# Patient Record
Sex: Female | Born: 1985 | Race: Black or African American | Hispanic: No | Marital: Single | Smoking: Never smoker
Health system: Southern US, Community
[De-identification: ages and names within clinical notes are randomized; demographics above are authoritative.]

## PROBLEM LIST (undated history)

## (undated) DIAGNOSIS — T8859XA Other complications of anesthesia, initial encounter: Secondary | ICD-10-CM

## (undated) DIAGNOSIS — IMO0002 Reserved for concepts with insufficient information to code with codable children: Secondary | ICD-10-CM

## (undated) DIAGNOSIS — T4145XA Adverse effect of unspecified anesthetic, initial encounter: Secondary | ICD-10-CM

## (undated) DIAGNOSIS — M329 Systemic lupus erythematosus, unspecified: Secondary | ICD-10-CM

## (undated) DIAGNOSIS — O009 Unspecified ectopic pregnancy without intrauterine pregnancy: Secondary | ICD-10-CM

## (undated) DIAGNOSIS — H409 Unspecified glaucoma: Secondary | ICD-10-CM

## (undated) DIAGNOSIS — D649 Anemia, unspecified: Secondary | ICD-10-CM

## (undated) HISTORY — PX: REFRACTIVE SURGERY: SHX103

## (undated) MED FILL — Ferric Derisomaltose (One Dose) IV Sol 1000 MG/10ML (Fe Eq): INTRAVENOUS | Qty: 10 | Status: AC

---
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
## 2006-05-22 ENCOUNTER — Emergency Department (HOSPITAL_COMMUNITY): Admission: EM | Admit: 2006-05-22 | Discharge: 2006-05-22 | Payer: Self-pay | Admitting: Emergency Medicine

## 2006-11-06 ENCOUNTER — Emergency Department (HOSPITAL_COMMUNITY): Admission: EM | Admit: 2006-11-06 | Discharge: 2006-11-06 | Payer: Self-pay | Admitting: Emergency Medicine

## 2007-02-05 ENCOUNTER — Emergency Department (HOSPITAL_COMMUNITY): Admission: EM | Admit: 2007-02-05 | Discharge: 2007-02-05 | Payer: Self-pay | Admitting: Family Medicine

## 2007-06-22 ENCOUNTER — Emergency Department (HOSPITAL_COMMUNITY): Admission: EM | Admit: 2007-06-22 | Discharge: 2007-06-22 | Payer: Self-pay | Admitting: Emergency Medicine

## 2009-11-03 ENCOUNTER — Emergency Department (HOSPITAL_COMMUNITY): Admission: EM | Admit: 2009-11-03 | Discharge: 2009-11-03 | Payer: Self-pay | Admitting: Emergency Medicine

## 2009-12-18 ENCOUNTER — Emergency Department (HOSPITAL_COMMUNITY): Admission: EM | Admit: 2009-12-18 | Discharge: 2009-12-18 | Payer: Self-pay | Admitting: Family Medicine

## 2010-01-29 ENCOUNTER — Emergency Department (HOSPITAL_COMMUNITY)
Admission: EM | Admit: 2010-01-29 | Discharge: 2010-01-29 | Payer: Self-pay | Source: Home / Self Care | Admitting: Emergency Medicine

## 2010-05-04 LAB — POCT URINALYSIS DIPSTICK
Glucose, UA: NEGATIVE mg/dL
Nitrite: NEGATIVE
Protein, ur: 30 mg/dL — AB
Specific Gravity, Urine: >=1.03 (ref 1.005–1.030)
Urobilinogen, UA: 1 mg/dL (ref 0.0–1.0)

## 2010-05-04 LAB — POCT PREGNANCY, URINE: Preg Test, Ur: NEGATIVE

## 2010-05-05 LAB — POCT URINALYSIS DIPSTICK
Glucose, UA: NEGATIVE mg/dL
Nitrite: POSITIVE — AB
Protein, ur: 30 mg/dL — AB
Specific Gravity, Urine: 1.02 (ref 1.005–1.030)
Urobilinogen, UA: 2 mg/dL — ABNORMAL HIGH (ref 0.0–1.0)

## 2010-05-05 LAB — POCT PREGNANCY, URINE: Preg Test, Ur: NEGATIVE

## 2010-05-05 LAB — WET PREP, GENITAL: Yeast Wet Prep HPF POC: NONE SEEN

## 2010-12-02 LAB — CBC
HCT: 29 — ABNORMAL LOW
Hemoglobin: 9.4 — ABNORMAL LOW
MCV: 74.8 — ABNORMAL LOW
WBC: 5.8

## 2010-12-02 LAB — DIFFERENTIAL
Eosinophils Absolute: 0.1
Eosinophils Relative: 3
Lymphocytes Relative: 44
Lymphs Abs: 2.6
Monocytes Absolute: 0.6

## 2010-12-02 LAB — URINALYSIS, ROUTINE W REFLEX MICROSCOPIC
Glucose, UA: NEGATIVE
Ketones, ur: NEGATIVE
Nitrite: NEGATIVE
Protein, ur: NEGATIVE
pH: 7

## 2010-12-02 LAB — URINE MICROSCOPIC-ADD ON

## 2010-12-02 LAB — PREGNANCY, URINE: Preg Test, Ur: NEGATIVE

## 2010-12-02 LAB — WET PREP, GENITAL

## 2011-09-20 ENCOUNTER — Encounter (HOSPITAL_COMMUNITY): Payer: Self-pay | Admitting: Emergency Medicine

## 2011-09-20 ENCOUNTER — Emergency Department (HOSPITAL_COMMUNITY)
Admission: EM | Admit: 2011-09-20 | Discharge: 2011-09-20 | Disposition: A | Discharge status: Home / Self Care | Payer: Self-pay | Attending: Emergency Medicine | Admitting: Emergency Medicine

## 2011-09-20 DIAGNOSIS — J020 Streptococcal pharyngitis: Secondary | ICD-10-CM

## 2011-09-20 DIAGNOSIS — H66019 Acute suppurative otitis media with spontaneous rupture of ear drum, unspecified ear: Secondary | ICD-10-CM | POA: Insufficient documentation

## 2011-09-20 HISTORY — DX: Unspecified glaucoma: H40.9

## 2011-09-20 LAB — RAPID STREP SCREEN (MED CTR MEBANE ONLY): Streptococcus, Group A Screen (Direct): POSITIVE — AB

## 2011-09-20 MED ORDER — AMOXICILLIN 500 MG PO CAPS: 500.0000 mg | ORAL_CAPSULE | Freq: Three times a day (TID) | ORAL | Status: AC

## 2011-09-20 NOTE — ED Notes (Signed)
Started with a sore throat last Wednesday, right ear ache- woke up this am with right ear draining, greenish drainage. Throat slightly reddened, swollen tonsil right side

## 2011-09-20 NOTE — ED Provider Notes (Signed)
Medical screening examination/treatment/procedure(s) were performed by non-physician practitioner and as supervising physician I was immediately available for consultation/collaboration.  Doug Sou, MD 09/20/11 1704

## 2011-09-20 NOTE — ED Provider Notes (Signed)
History     CSN: [id number]  Arrival date & time 09/20/11  1159   First MD Initiated Contact with Patient 09/20/11 1339      Chief Complaint  Patient presents with  . Otalgia  . Sore Throat    (Consider location/radiation/quality/duration/timing/severity/associated sxs/prior treatment) HPI  26 year old female in no acute distress complaining of sore throat that began 6 days ago patient had a pain in her right ear and woke up this morning with greenish drainage from the ear. Patient denies fever, cough, tinnitus, rhinorrhea, nausea, vomiting,abdominal pain  Past Medical History  Diagnosis Date  . Glaucoma     follows up with opthalmologist once a year for pressure check    Past Surgical History  Procedure Date  . Refractive surgery     History reviewed. No pertinent family history.  History  Substance Use Topics  . Smoking status: Never Smoker   . Smokeless tobacco: Not on file  . Alcohol Use: Yes     socially    OB History    Grav Para Term Preterm Abortions TAB SAB Ect Mult Living                  Review of Systems  Constitutional: Negative for fatigue.  HENT: Positive for ear pain. Negative for tinnitus.   Respiratory: Negative for cough.   All other systems reviewed and are negative.    Allergies  Review of patient's allergies indicates no known allergies.  Home Medications   Current Outpatient Rx  Name Route Sig Dispense Refill  . ACETAMINOPHEN 325 MG PO TABS Oral Take 650 mg by mouth every 6 (six) hours as needed. For sore throat, ear pain      BP 121/89  Pulse 72  Temp 98.8 F (37.1 C) (Oral)  Resp 14  SpO2 100%  LMP 09/15/2011  Physical Exam  Vitals reviewed. Constitutional: She is oriented to person, place, and time. She appears well-developed and well-nourished. No distress.  HENT:  Head: Normocephalic and atraumatic.  Right Ear: External ear normal.  Nose: Nose normal.  Mouth/Throat: Oropharynx is clear and moist.       Mild  tonsillar hypertrophy with no exudate. Patient has a ruptured right tympanic membrane with purulent drainage. Left TM normal  Eyes: Conjunctivae and EOM are normal. Pupils are equal, round, and reactive to light.  Neck: Normal range of motion.  Cardiovascular: Normal rate.   Pulmonary/Chest: Effort normal.  Musculoskeletal: Normal range of motion.  Lymphadenopathy:    She has cervical adenopathy.  Neurological: She is alert and oriented to person, place, and time.  Psychiatric: She has a normal mood and affect.    ED Course  Procedures (including critical care time)  Labs Reviewed  RAPID STREP SCREEN - Abnormal; Notable for the following:    Streptococcus, Group A Screen (Direct) POSITIVE (*)     All other components within normal limits   No results found.   1. Acute suppurative otitis media with spontaneous rupture of eardrum       MDM  Patient presenting with sore throat positive and her rapid strep screen patient also has otitis media with acute rupture of the right tympanic membrane. I'll  treat with amoxicillin for 10 days.         Wynetta Emery, PA-C 09/20/11 1433

## 2012-12-14 ENCOUNTER — Encounter (HOSPITAL_COMMUNITY)
Admission: AD | Disposition: A | Discharge status: Home / Self Care | Payer: Self-pay | Source: Ambulatory Visit | Attending: Obstetrics & Gynecology

## 2012-12-14 ENCOUNTER — Observation Stay (HOSPITAL_COMMUNITY)
Admission: AD | Admit: 2012-12-14 | Discharge: 2012-12-15 | Disposition: A | Discharge status: Home / Self Care | Payer: Medicaid Other | Source: Ambulatory Visit | Attending: Obstetrics & Gynecology | Admitting: Obstetrics & Gynecology

## 2012-12-14 ENCOUNTER — Ambulatory Visit (HOSPITAL_BASED_OUTPATIENT_CLINIC_OR_DEPARTMENT_OTHER): Admit: 2012-12-14 | Payer: Self-pay | Admitting: Obstetrics and Gynecology

## 2012-12-14 ENCOUNTER — Inpatient Hospital Stay (HOSPITAL_BASED_OUTPATIENT_CLINIC_OR_DEPARTMENT_OTHER): Payer: Medicaid Other | Admitting: Anesthesiology

## 2012-12-14 ENCOUNTER — Encounter (HOSPITAL_BASED_OUTPATIENT_CLINIC_OR_DEPARTMENT_OTHER): Payer: Medicaid Other | Admitting: Anesthesiology

## 2012-12-14 ENCOUNTER — Inpatient Hospital Stay (HOSPITAL_COMMUNITY): Payer: Medicaid Other

## 2012-12-14 ENCOUNTER — Encounter (HOSPITAL_COMMUNITY): Payer: Self-pay

## 2012-12-14 ENCOUNTER — Ambulatory Visit (HOSPITAL_BASED_OUTPATIENT_CLINIC_OR_DEPARTMENT_OTHER): Admission: RE | Admit: 2012-12-14 | Payer: Self-pay | Source: Ambulatory Visit | Admitting: Obstetrics and Gynecology

## 2012-12-14 DIAGNOSIS — D251 Intramural leiomyoma of uterus: Secondary | ICD-10-CM | POA: Insufficient documentation

## 2012-12-14 DIAGNOSIS — O009 Unspecified ectopic pregnancy without intrauterine pregnancy: Secondary | ICD-10-CM | POA: Diagnosis present

## 2012-12-14 DIAGNOSIS — R109 Unspecified abdominal pain: Secondary | ICD-10-CM

## 2012-12-14 DIAGNOSIS — O00109 Unspecified tubal pregnancy without intrauterine pregnancy: Secondary | ICD-10-CM

## 2012-12-14 HISTORY — PX: OPERATIVE ULTRASOUND: SHX5996

## 2012-12-14 LAB — CBC
HCT: 28.8 % — ABNORMAL LOW (ref 36.0–46.0)
HCT: 27.2 % — ABNORMAL LOW (ref 36.0–46.0)
Hemoglobin: 9.5 g/dL — ABNORMAL LOW (ref 12.0–15.0)
MCH: 27.7 pg (ref 26.0–34.0)
MCH: 28.4 pg (ref 26.0–34.0)
MCHC: 34 g/dL (ref 30.0–36.0)
MCHC: 34.9 g/dL (ref 30.0–36.0)
MCV: 81.4 fL (ref 78.0–100.0)
MCV: 81.2 fL (ref 78.0–100.0)
Platelets: 295 10*3/uL (ref 150–400)
RBC: 3.35 MIL/uL — ABNORMAL LOW (ref 3.87–5.11)
RDW: 13.5 % (ref 11.5–15.5)
WBC: 10.1 10*3/uL (ref 4.0–10.5)
WBC: 7.8 10*3/uL (ref 4.0–10.5)

## 2012-12-14 LAB — COMPREHENSIVE METABOLIC PANEL
ALT: 10 U/L (ref 0–35)
AST: 14 U/L (ref 0–37)
Albumin: 3.1 g/dL — ABNORMAL LOW (ref 3.5–5.2)
Alkaline Phosphatase: 51 U/L (ref 39–117)
Potassium: 3.3 mEq/L — ABNORMAL LOW (ref 3.5–5.1)
Sodium: 136 mEq/L (ref 135–145)
Total Protein: 6.7 g/dL (ref 6.0–8.3)

## 2012-12-14 LAB — TYPE AND SCREEN: Unit division: 0

## 2012-12-14 LAB — HCG, QUANTITATIVE, PREGNANCY: hCG, Beta Chain, Quant, S: 53276 m[IU]/mL — ABNORMAL HIGH (ref <5)

## 2012-12-14 LAB — POCT HEMOGLOBIN-HEMACUE: Hemoglobin: 9.6 g/dL — ABNORMAL LOW (ref 12.0–15.0)

## 2012-12-14 LAB — ABO/RH: ABO/RH(D): A POS

## 2012-12-14 LAB — PREPARE RBC (CROSSMATCH)

## 2012-12-14 SURGERY — US INTRAOPERATIVE
Anesthesia: Monitor Anesthesia Care | Site: Uterus | Laterality: Right | Wound class: Clean Contaminated

## 2012-12-14 SURGERY — US INTRAOPERATIVE
Anesthesia: Monitor Anesthesia Care | Laterality: Right

## 2012-12-14 MED ORDER — ACETAMINOPHEN 325 MG PO TABS: 650.0000 mg | ORAL_TABLET | ORAL | Status: DC | PRN

## 2012-12-14 MED ORDER — LACTATED RINGERS IV SOLN: INTRAVENOUS | Status: DC

## 2012-12-14 MED ORDER — PROMETHAZINE HCL 25 MG/ML IJ SOLN
6.2500 mg | INTRAMUSCULAR | Status: DC | PRN
  Filled 2012-12-14: qty 1

## 2012-12-14 MED ORDER — HYDROCODONE-ACETAMINOPHEN 5-325 MG PO TABS
1.0000 | ORAL_TABLET | ORAL | Status: DC | PRN
  Administered 2012-12-14: 2 via ORAL
  Filled 2012-12-14: qty 2

## 2012-12-14 MED ORDER — POTASSIUM CHLORIDE 2 MEQ/ML IV SOLN
5.0000 meq | Freq: Once | INTRAVENOUS | Status: AC
  Administered 2012-12-14: 5 meq
  Filled 2012-12-14 (×2): qty 2.5

## 2012-12-14 MED ORDER — LIDOCAINE HCL 1 % IJ SOLN
INTRAMUSCULAR | Status: DC | PRN
  Administered 2012-12-14: 10 mL

## 2012-12-14 MED ORDER — MIDAZOLAM HCL 2 MG/2ML IJ SOLN: 0.5000 mg | Freq: Once | INTRAMUSCULAR | Status: DC | PRN

## 2012-12-14 MED ORDER — MEPERIDINE HCL 25 MG/ML IJ SOLN: 6.2500 mg | INTRAMUSCULAR | Status: DC | PRN

## 2012-12-14 MED ORDER — ONDANSETRON HCL 4 MG/2ML IJ SOLN
4.0000 mg | Freq: Four times a day (QID) | INTRAMUSCULAR | Status: DC | PRN
  Administered 2012-12-14: 4 mg via INTRAVENOUS
  Filled 2012-12-14: qty 2

## 2012-12-14 MED ORDER — OXYCODONE-ACETAMINOPHEN 5-325 MG PO TABS
1.0000 | ORAL_TABLET | ORAL | Status: DC | PRN
  Administered 2012-12-15: 1 via ORAL
  Filled 2012-12-14: qty 1

## 2012-12-14 MED ORDER — HYDROMORPHONE HCL PF 1 MG/ML IJ SOLN: 0.2500 mg | INTRAMUSCULAR | Status: DC | PRN

## 2012-12-14 MED ORDER — METHOTREXATE INJECTION FOR WOMEN'S HOSPITAL
50.0000 mg/m2 | Freq: Once | INTRAMUSCULAR | Status: AC
  Administered 2012-12-14: 90 mg via INTRAMUSCULAR
  Filled 2012-12-14: qty 1.8

## 2012-12-14 MED ORDER — PROMETHAZINE HCL 25 MG/ML IJ SOLN: 6.2500 mg | INTRAMUSCULAR | Status: DC | PRN

## 2012-12-14 MED ORDER — DEXTROSE 5 % IV SOLN
100.0000 mg | Freq: Two times a day (BID) | INTRAVENOUS | Status: DC
  Administered 2012-12-14: 100 mg via INTRAVENOUS
  Filled 2012-12-14 (×2): qty 100

## 2012-12-14 MED ORDER — FENTANYL CITRATE 0.05 MG/ML IJ SOLN
INTRAMUSCULAR | Status: DC | PRN
  Administered 2012-12-14 (×2): 25 ug via INTRAVENOUS
  Administered 2012-12-14: 50 ug via INTRAVENOUS

## 2012-12-14 MED ORDER — ONDANSETRON HCL 4 MG PO TABS
4.0000 mg | ORAL_TABLET | Freq: Four times a day (QID) | ORAL | Status: DC | PRN
  Administered 2012-12-15: 4 mg via ORAL
  Filled 2012-12-14: qty 1

## 2012-12-14 MED ORDER — PROPOFOL 10 MG/ML IV EMUL
INTRAVENOUS | Status: DC | PRN
  Administered 2012-12-14: 140 ug/kg/min via INTRAVENOUS

## 2012-12-14 MED ORDER — KETOROLAC TROMETHAMINE 30 MG/ML IJ SOLN: 15.0000 mg | Freq: Once | INTRAMUSCULAR | Status: AC | PRN

## 2012-12-14 MED ORDER — LACTATED RINGERS IV SOLN
INTRAVENOUS | Status: DC
  Administered 2012-12-14 (×3): via INTRAVENOUS

## 2012-12-14 MED ORDER — LACTATED RINGERS IV BOLUS (SEPSIS)
1000.0000 mL | Freq: Once | INTRAVENOUS | Status: AC
  Administered 2012-12-14: 1000 mL via INTRAVENOUS

## 2012-12-14 MED ORDER — SIMETHICONE 80 MG PO CHEW
80.0000 mg | CHEWABLE_TABLET | Freq: Four times a day (QID) | ORAL | Status: DC | PRN
  Filled 2012-12-14: qty 1

## 2012-12-14 MED ORDER — OXYCODONE-ACETAMINOPHEN 5-325 MG PO TABS
2.0000 | ORAL_TABLET | Freq: Once | ORAL | Status: AC
  Administered 2012-12-14: 2 via ORAL
  Filled 2012-12-14: qty 2

## 2012-12-14 MED ORDER — TEMAZEPAM 15 MG PO CAPS: 15.0000 mg | ORAL_CAPSULE | Freq: Every evening | ORAL | Status: DC | PRN

## 2012-12-14 MED ORDER — FENTANYL CITRATE 0.05 MG/ML IJ SOLN: 25.0000 ug | INTRAMUSCULAR | Status: DC | PRN

## 2012-12-14 MED ORDER — OXYCODONE-ACETAMINOPHEN 5-325 MG PO TABS: 1.0000 | ORAL_TABLET | ORAL | Status: DC | PRN

## 2012-12-14 MED ORDER — MIDAZOLAM HCL 5 MG/5ML IJ SOLN
INTRAMUSCULAR | Status: DC | PRN
  Administered 2012-12-14: 2 mg via INTRAVENOUS

## 2012-12-14 SURGICAL SUPPLY — 21 items
CATH ROBINSON RED A/P 16FR (CATHETERS) ×2 IMPLANT
CLOTH BEACON ORANGE TIMEOUT ST (SAFETY) ×2 IMPLANT
COVER TABLE BACK 60X90 (DRAPES) ×2 IMPLANT
DRAPE LG THREE QUARTER DISP (DRAPES) ×2 IMPLANT
DRAPE UNDERBUTTOCKS STRL (DRAPE) ×2 IMPLANT
DRESSING TELFA 8X3 (GAUZE/BANDAGES/DRESSINGS) ×2 IMPLANT
GLOVE BIO SURGEON STRL SZ7 (GLOVE) ×2 IMPLANT
GLOVE BIOGEL M 8.0 STRL (GLOVE) ×4 IMPLANT
GLOVE INDICATOR 7.0 STRL GRN (GLOVE) ×4 IMPLANT
GOWN PREVENTION PLUS LG XLONG (DISPOSABLE) ×2 IMPLANT
LEGGING LITHOTOMY PAIR STRL (DRAPES) ×2 IMPLANT
NEEDLE SPNL 22GX3.5 QUINCKE BK (NEEDLE) ×2 IMPLANT
NS IRRIG 500ML POUR BTL (IV SOLUTION) IMPLANT
PACK BASIN DAY SURGERY FS (CUSTOM PROCEDURE TRAY) ×2 IMPLANT
PAD OB MATERNITY 4.3X12.25 (PERSONAL CARE ITEMS) ×2 IMPLANT
PAD PREP 24X48 CUFFED NSTRL (MISCELLANEOUS) ×2 IMPLANT
SURGILUBE 2OZ TUBE FLIPTOP (MISCELLANEOUS) ×2 IMPLANT
SYR CONTROL 10ML LL (SYRINGE) ×2 IMPLANT
TOWEL OR 17X24 6PK STRL BLUE (TOWEL DISPOSABLE) ×2 IMPLANT
TRAY DSU PREP LF (CUSTOM PROCEDURE TRAY) ×2 IMPLANT
WATER STERILE IRR 500ML POUR (IV SOLUTION) ×2 IMPLANT

## 2012-12-14 NOTE — Anesthesia Preprocedure Evaluation (Addendum)
Anesthesia Evaluation  Patient identified by MRN, date of birth, ID band Patient awake    Reviewed: Allergy & Precautions, H&P , NPO status , Patient's Chart, lab work & pertinent test results  Airway Mallampati: II TM Distance: >3 FB Neck ROM: Full    Dental no notable dental hx. (+) Teeth Intact and Dental Advisory Given   Pulmonary neg pulmonary ROS,  breath sounds clear to auscultation  Pulmonary exam normal       Cardiovascular negative cardio ROS  Rhythm:Regular Rate:Normal     Neuro/Psych negative neurological ROS  negative psych ROS   GI/Hepatic negative GI ROS, Neg liver ROS,   Endo/Other  negative endocrine ROS  Renal/GU negative Renal ROS  negative genitourinary   Musculoskeletal negative musculoskeletal ROS (+)   Abdominal   Peds  Hematology negative hematology ROS (+)   Anesthesia Other Findings Ectopic pregnancy at [redacted] wks gestation.  Reproductive/Obstetrics Right Ectopic pregnancy                        Anesthesia Physical Anesthesia Plan  ASA: II and emergent  Anesthesia Plan: MAC   Post-op Pain Management:    Induction: Intravenous, Rapid sequence and Cricoid pressure planned  Airway Management Planned:   Additional Equipment:   Intra-op Plan:   Post-operative Plan:   Informed Consent: I have reviewed the patients History and Physical, chart, labs and discussed the procedure including the risks, benefits and alternatives for the proposed anesthesia with the patient or authorized representative who has indicated his/her understanding and acceptance.   Dental advisory given  Plan Discussed with: CRNA, Anesthesiologist and Surgeon  Anesthesia Plan Comments:        Anesthesia Quick Evaluation

## 2012-12-14 NOTE — Progress Notes (Signed)
Transferred to [facility] via stretcher by [facility]. Report given to Cira Rue RN. Room assignment 318.

## 2012-12-14 NOTE — Progress Notes (Signed)
Dr. Penne Lash states patient will go to [facility] surgical center around 1030. Requests that MTX be given prior to transport.

## 2012-12-14 NOTE — Progress Notes (Signed)
12/14/12 1600  Clinical Encounter Type  Visited With Patient not available (Spiritual Care following from a distance; page for visit.)   Spiritual Care notified of pt's situation just as she was preparing for transport from [facility] to [facility] for procedure.  Reported situation at shift change (4pm) for night/weekend chaplain's information.  Please page for spiritual support tonight or this weekend as would be helpful for pt:  [phone].  Thank you!  [address] [place], [place] [phone]

## 2012-12-14 NOTE — Progress Notes (Signed)
Patient returned to U/S with Dr. Penne Lash and Dr. April Manson.

## 2012-12-14 NOTE — OR Nursing (Signed)
2.21ml= mEq of 2 mEq/ml injected by Dr. April Manson. PT transferred from [facility] to [facility]

## 2012-12-14 NOTE — MAU Provider Note (Signed)
Chief Complaint: Abdominal Pain   First Provider Initiated Contact with Patient 12/14/12 0301      SUBJECTIVE HPI: Dominique Jones is a 27 y.o. G1P0 at [redacted]w[redacted]d by LMP who presents with severe low abd cramping radiating to right buttock since 0145. Pos UPT at HD last week. No other testing at this pregnancy. Denies vaginal, vaginal discharge, fever, chills, urinary complaints, GI complaints except for mild constipation. Last bowel movement yesterday. Last solids 2200.  Past Medical History  Diagnosis Date  . Glaucoma     follows up with opthalmologist once a year for pressure check   OB History  Gravida Para Term Preterm AB SAB TAB Ectopic Multiple Living  1             # Outcome Date GA Lbr Len/2nd Weight Sex Delivery Anes PTL Lv  1 CUR              Past Surgical History  Procedure Laterality Date  . Refractive surgery     History   Social History  . Marital Status: Single    Spouse Name: N/A    Number of Children: N/A  . Years of Education: N/A   Occupational History  . Not on file.   Social History Main Topics  . Smoking status: Never Smoker   . Smokeless tobacco: Not on file  . Alcohol Use: Yes     Comment: socially  . Drug Use: No  . Sexual Activity: Yes    Birth Control/ Protection: None   Other Topics Concern  . Not on file   Social History Narrative  . No narrative on file   No current facility-administered medications on file prior to encounter.   Current Outpatient Prescriptions on File Prior to Encounter  Medication Sig Dispense Refill  . acetaminophen (TYLENOL) 325 MG tablet Take 650 mg by mouth every 6 (six) hours as needed. For sore throat, ear pain       No Known Allergies  ROS: Pertinent items in HPI  OBJECTIVE Blood pressure 150/82, pulse 80, temperature 97.8 F (36.6 C), temperature source Oral, resp. rate 20, height 5\' 6"  (1.676 m), weight 69.945 kg (154 lb 3.2 oz), last menstrual period 10/05/2012, SpO2 100.00%. GENERAL: Well-developed,  well-nourished female in moderate distress, rocking back and forth.  HEENT: Normocephalic HEART: normal rate RESP: normal effort ABDOMEN: Distended. Mild, bilateral, diffuse low abdominal tenderness. Positive bowel sounds. Positive guarding. UTA mass. Negative rebound. EXTREMITIES: Nontender, no edema NEURO: Alert and oriented SPECULUM EXAM: Deferred  LAB RESULTS Results for orders placed during the hospital encounter of 12/14/12 (from the past 24 hour(s))  HCG, QUANTITATIVE, PREGNANCY     Status: Abnormal   Collection Time    12/14/12  3:15 AM      Result Value Range   hCG, Beta Chain, Quant, [place] [address] (*) <5 mIU/mL  ABO/RH     Status: None   Collection Time    12/14/12  3:15 AM      Result Value Range   ABO/RH(D) A POS    COMPREHENSIVE METABOLIC PANEL     Status: Abnormal   Collection Time    12/14/12  3:15 AM      Result Value Range   Sodium 136  135 - 145 mEq/L   Potassium 3.3 (*) 3.5 - 5.1 mEq/L   Chloride 99  96 - 112 mEq/L   CO2 25  19 - 32 mEq/L   Glucose, Bld 112 (*) 70 - 99 mg/dL  BUN 12  6 - 23 mg/dL   Creatinine, Ser 9.14  0.50 - 1.10 mg/dL   Calcium 8.9  8.4 - 78.2 mg/dL   Total Protein 6.7  6.0 - 8.3 g/dL   Albumin 3.1 (*) 3.5 - 5.2 g/dL   AST 14  0 - 37 U/L   ALT 10  0 - 35 U/L   Alkaline Phosphatase 51  39 - 117 U/L   Total Bilirubin 0.1 (*) 0.3 - 1.2 mg/dL   GFR calc non Af Amer >90  >90 mL/min   GFR calc Af Amer >90  >90 mL/min  CBC     Status: Abnormal   Collection Time    12/14/12  3:15 AM      Result Value Range   WBC 10.1  4.0 - 10.5 K/uL   RBC 3.54 (*) 3.87 - 5.11 MIL/uL   Hemoglobin 9.8 (*) 12.0 - 15.0 g/dL   HCT 95.6 (*) 21.3 - 08.6 %   MCV 81.4  78.0 - 100.0 fL   MCH 27.7  26.0 - 34.0 pg   MCHC 34.0  30.0 - 36.0 g/dL   RDW 57.8  46.9 - 62.9 %   Platelets 316  150 - 400 K/uL  TYPE AND SCREEN     Status: None   Collection Time    12/14/12  5:15 AM      Result Value Range   ABO/RH(D) A POS     Antibody Screen NEG     Sample Expiration  12/17/2012     Unit Number [id number]     Blood Component Type RED CELLS,LR     Unit division 00     Status of Unit ALLOCATED     Transfusion Status OK TO TRANSFUSE     Crossmatch Result Compatible     Unit Number [id number]     Blood Component Type RED CELLS,LR     Unit division 00     Status of Unit ALLOCATED     Transfusion Status OK TO TRANSFUSE     Crossmatch Result Compatible    PREPARE RBC (CROSSMATCH)     Status: None   Collection Time    12/14/12  5:30 AM      Result Value Range   Order Confirmation ORDER PROCESSED BY BLOOD BANK      IMAGING US Ob Comp Less 14 Wks  12/14/2012   CLINICAL DATA:  Lower abdominal cramping. Estimated gestational age by LMP is 10 weeks 0 days.  EXAM: OBSTETRIC <14 WK [place] AND TRANSVAGINAL OB US  TECHNIQUE: Both transabdominal and transvaginal ultrasound examinations were performed for complete evaluation of the gestation as well as the maternal uterus, adnexal regions, and pelvic cul-de-sac. Transvaginal technique was performed to assess early pregnancy.  COMPARISON:  None.  FINDINGS: Intrauterine gestational sac: No intrauterine gestational sac is visualized. The uterus is predominantly replaced by a large heterogeneous fibroid. The fibroid measures 10.8 x 9.7 x 9 cm. The endometrium is not separately visualized and is probably displaced by the fibroid. The cervix and lower uterine segment are visualized.  There is a gestational sac visualized in the right pelvis. Localization is difficult. The right ovary and tube are not visualized for correlation. The gestational sac appears to be located inferior to the body of the uterus at the level of the cervix/lower uterine segment on the right. The gestational sac contains a yolk sac and a living fetus. Fetal motion and cardiac activity are observed. The fetal heart rate is 137  beats per min.  CRL:   43.1  mm   11 w 2 d                  [place] EDC: 07/03/2013  The left ovary is visualized and is normal in  appearance. There is a small amount of free fluid around the left ovary.  IMPRESSION: There appears to be a right sided ectopic pregnancy. Estimated gestational age by crown-rump length is 11 weeks 2 days. There is a large fibroid in the uterus which obscures visualization of the endometrium. The left ovary is normal. There is a small amount of free fluid around the left ovary.  Results were discussed by telephone with nurse midwife Dorathy Kinsman at [phone] hr on 12/14/2012.   Electronically Signed   By: Burman Nieves M.D.   On: 12/14/2012 04:43   US Ob Transvaginal  12/14/2012   CLINICAL DATA:  Lower abdominal cramping. Estimated gestational age by LMP is 10 weeks 0 days.  EXAM: OBSTETRIC <14 WK [place] AND TRANSVAGINAL OB US  TECHNIQUE: Both transabdominal and transvaginal ultrasound examinations were performed for complete evaluation of the gestation as well as the maternal uterus, adnexal regions, and pelvic cul-de-sac. Transvaginal technique was performed to assess early pregnancy.  COMPARISON:  None.  FINDINGS: Intrauterine gestational sac: No intrauterine gestational sac is visualized. The uterus is predominantly replaced by a large heterogeneous fibroid. The fibroid measures 10.8 x 9.7 x 9 cm. The endometrium is not separately visualized and is probably displaced by the fibroid. The cervix and lower uterine segment are visualized.  There is a gestational sac visualized in the right pelvis. Localization is difficult. The right ovary and tube are not visualized for correlation. The gestational sac appears to be located inferior to the body of the uterus at the level of the cervix/lower uterine segment on the right. The gestational sac contains a yolk sac and a living fetus. Fetal motion and cardiac activity are observed. The fetal heart rate is 137 beats per min.  CRL:   43.1  mm   11 w 2 d                  [place] EDC: 07/03/2013  The left ovary is visualized and is normal in appearance. There is a small amount of  free fluid around the left ovary.  IMPRESSION: There appears to be a right sided ectopic pregnancy. Estimated gestational age by crown-rump length is 11 weeks 2 days. There is a large fibroid in the uterus which obscures visualization of the endometrium. The left ovary is normal. There is a small amount of free fluid around the left ovary.  Results were discussed by telephone with nurse midwife Dorathy Kinsman at [phone] hr on 12/14/2012.   Electronically Signed   By: Burman Nieves M.D.   On: 12/14/2012 04:43   MAU COURSE Percocet given for pain. NPO, CBC, Quant, [place], ABO/Rh.  Dr. Penne Lash notified of abnormal [place] results. Discussing w/ Radiologist. Consulting w/ Dr. April Manson. Will come rescan pt. Dr. Penne Lash discussing [place] w/ Pt and family and that further evel is needed to determine location of pregnancy and management. T and S. IV started. NPO.   Dr. Penne Lash assuming care of pt at [phone].  [place], [place] 12/14/2012  6:56 AM  Clnical Impression: Ectopic pregnancy located in posterior cul de sac. 11 week 2 day size with heart beat  Pt seen and examined.  Reviewed films with radiologist who was unsure of exact location  of pregnancy.  I consulted Dr. Clint Bolder who re scanned patient confirming extrauterine pregnancy.  It is located behind the uterus at the level of the cervix and behind a 10 cm fibroid.  Pt and family informed of diagnosis.  Extensive conversation about the fetus will not be salvageable and the patient's life is at risk given possibility of abdominal pregnancy.  It will be necessary to devascularize the pregnancy with KCl injection into fetal thorax and systemic methotrexate.  Pt will then be followed with BHCGs and scheduled for laparotomy to remove pregnancy.  MTX received in MAU Dr. Clint Bolder performing transvaginal US guided injection of KCl at [facility] hospital. Pt will spend at least one night in [facility] for observation.

## 2012-12-14 NOTE — Transfer of Care (Signed)
Immediate Anesthesia Transfer of Care Note  Patient: Dominique Jones  Procedure(s) Performed: Procedure(s) (LRB): OPERATIVE ULTRASOUND GUIDED INJECTION OF POTASSIUM CHLORIDE 2.5ML (Right)  Patient Location: PACU  Anesthesia Type: MAC  Level of Consciousness: awake, alert , oriented and patient cooperative  Airway & Oxygen Therapy: Patient Spontanous Breathing and Patient connected to face mask oxygen  Post-op Assessment: Report given to PACU RN and Post -op Vital signs reviewed and stable  Post vital signs: Reviewed and stable  Complications: No apparent anesthesia complications

## 2012-12-14 NOTE — MAU Note (Signed)
Lower abdominal cramping since 145 this morning. Denies VB or discharge.

## 2012-12-14 NOTE — Progress Notes (Signed)
Dr. Penne Lash discussing results with patient, FOB and patient mother. Discussing immediate and long term plan of care.

## 2012-12-14 NOTE — Anesthesia Postprocedure Evaluation (Signed)
Anesthesia Post Note  Patient: Dominique Jones  Procedure(s) Performed: Procedure(s) (LRB): OPERATIVE ULTRASOUND GUIDED INJECTION OF POTASSIUM CHLORIDE 2.5ML (Right)  Anesthesia type: MAC  Patient location: PACU  Post pain: Pain level controlled  Post assessment: Post-op Vital signs reviewed  Last Vitals:  Filed Vitals:   12/14/12 1345  BP: 128/91  Pulse: 67  Temp:   Resp: 16    Post vital signs: Reviewed  Level of consciousness: sedated  Complications: No apparent anesthesia complications

## 2012-12-14 NOTE — Op Note (Signed)
OPERATIVE NOTE  Preoperative diagnosis: Ectopic pregnancy at 11 weeks, with positive fetal cardiac activity  Postoperative diagnosis: Ectopic pregnancy at 11 weeks, with positive fetal cardiac activity  Procedure: Transvaginal ultrasound guided, fetal cardiac injection of potassium chloride Anesthesia: Modified anesthesia care  Surgeon: Fermin Schwab, MD  Complications: None   Estimated blood loss: <20 mL  Description of the procedure: Patient was placed in lithotomy position modified anesthesia care was started with midazolam, fentanyl and propofol. 100 mg of doxycycline were given intravenously for prophylaxis. She she was prepped and draped in a sterile manner. Trans vaginal ultrasound confirmed 11 weeks size extra uterine pregnancy on the right side of the posterior cul-de-sac, adjacent to the right ovary, with minimal free fluid in the pelvis. In addition a large transmural uterine fibroid was confirmed on the left side of the endometrial cavity. A vaginal speculum was inserted. 1% lidocaine was injected into the right side of the posterior cul-de-sac for additional anesthesia. A 22-gauge 12 inch long echo tip needle was passed through the needle guide of the transvaginal probe and a puncture was made in the posterior vaginal fornix on the right side. The needle was carefully passed into the gestational sac then into the fetal thorax under ultrasound guidance. 5 mEq of potassium chloride into milliliters was injected and the cessation of cardiac activity was confirmed. The needle was withdrawn and hemostasis was checked. The patient tolerated the procedure well and was transferred to recovery in satisfactory condition.  She received a methotrexate 50 mg per meter square body surface area this morning. She will be on observation for one to 2 days for infectious or hemorrhagic complications. She will have a CBC drawn tomorrow. She will go to the lab as outpatient in 3 days and then in 6 days  for hCG level, and then hCG, CBC, and AST levels respectively. 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  Fermin Schwab

## 2012-12-14 NOTE — Progress Notes (Signed)
Patient given information and precautions for MTX. Given ectopic pregnancy info, support group info and heart pillow.

## 2012-12-15 DIAGNOSIS — O008 Other ectopic pregnancy without intrauterine pregnancy: Secondary | ICD-10-CM

## 2012-12-15 DIAGNOSIS — O009 Unspecified ectopic pregnancy without intrauterine pregnancy: Secondary | ICD-10-CM | POA: Diagnosis present

## 2012-12-15 LAB — CBC
HCT: 26.8 % — ABNORMAL LOW (ref 36.0–46.0)
Hemoglobin: 9.3 g/dL — ABNORMAL LOW (ref 12.0–15.0)
MCH: 28.4 pg (ref 26.0–34.0)
MCHC: 34.7 g/dL (ref 30.0–36.0)
MCV: 81.7 fL (ref 78.0–100.0)
Platelets: 274 10*3/uL (ref 150–400)
RDW: 13.7 % (ref 11.5–15.5)

## 2012-12-15 MED ORDER — FERROUS SULFATE 325 (65 FE) MG PO TABS: 325.0000 mg | ORAL_TABLET | Freq: Two times a day (BID) | ORAL | Status: DC

## 2012-12-15 MED ORDER — ONDANSETRON HCL 4 MG PO TABS: 4.0000 mg | ORAL_TABLET | Freq: Four times a day (QID) | ORAL | Status: DC | PRN

## 2012-12-15 NOTE — Progress Notes (Signed)
Pt d/c home with family via WC to private car. D/c instructions and medications reviewed with patient. Pt verbalized understanding. Pt has f/u appt. on 10/27. Pt in stable condition for discharge.

## 2012-12-15 NOTE — Discharge Summary (Signed)
Physician Discharge Summary  Patient ID: Dominique Jones MRN: [id number] DOB/AGE: 26-30-1987 26 y.o.  Admit date: 12/14/2012 Discharge date: 12/15/2012  Admission Diagnoses:right ectopic pregnancy, 11 weeks  Discharge Diagnoses: same Active Problems:   Ectopic pregnancy   Discharged Condition: good  Hospital Course: patient was found to have live ectopic pregnancy 11 weeks on right side with 10 cm uterine fibroid  Consults: Dr. April Manson  Significant Diagnostic Studies: radiology: Ultrasound: pelvic  Treatments: procedures: ultrasound guided injection of KCL fetal intracardiac for termination of ectopic pregnancy and methotrexate IM injection  Discharge Exam: Blood pressure 123/79, pulse 77, temperature 98.9 F (37.2 C), temperature source Oral, resp. rate 16, height 5\' 6"  (1.676 m), weight 154 lb (69.854 kg), last menstrual period 10/05/2012, SpO2 99.00%. No distress Abdomen not tender, uterine mass 15 weeks firm  CBC    Component Value Date/Time   WBC 6.9 12/15/2012 0503   RBC 3.28* 12/15/2012 0503   HGB 9.3* 12/15/2012 0503   HCT 26.8* 12/15/2012 0503   PLT 274 12/15/2012 0503   MCV 81.7 12/15/2012 0503   MCH 28.4 12/15/2012 0503   MCHC 34.7 12/15/2012 0503   RDW 13.7 12/15/2012 0503   LYMPHSABS 2.6 11/06/2006 1718   MONOABS 0.6 11/06/2006 1718   EOSABS 0.1 11/06/2006 1718   BASOSABS 0.1 11/06/2006 1718     Disposition: 01-Home or Self Care     Medication List    STOP taking these medications       prenatal multivitamin Tabs tablet      FeSO4 325 mg     Follow-up Information   Follow up with Fermin Schwab, MD. Call in 2 days. (beta HCG Monday)    Specialty:  Obstetrics and Gynecology   Contact information:   [address] [place] [address] [phone]       Signed: Scheryl Darter 12/15/2012, 7:47 AM

## 2012-12-17 ENCOUNTER — Encounter (HOSPITAL_BASED_OUTPATIENT_CLINIC_OR_DEPARTMENT_OTHER): Payer: Self-pay | Admitting: Obstetrics and Gynecology

## 2012-12-26 ENCOUNTER — Encounter (HOSPITAL_COMMUNITY): Payer: Self-pay | Admitting: Anesthesiology

## 2012-12-26 ENCOUNTER — Ambulatory Visit (HOSPITAL_COMMUNITY): Payer: Medicaid Other | Admitting: Anesthesiology

## 2012-12-26 ENCOUNTER — Encounter (HOSPITAL_COMMUNITY): Payer: Medicaid Other | Admitting: Anesthesiology

## 2012-12-26 ENCOUNTER — Inpatient Hospital Stay (HOSPITAL_COMMUNITY)
Admission: RE | Admit: 2012-12-26 | Discharge: 2012-12-30 | DRG: 777 | Disposition: A | Discharge status: Home / Self Care | Payer: Medicaid Other | Source: Ambulatory Visit | Attending: Obstetrics & Gynecology | Admitting: Obstetrics & Gynecology

## 2012-12-26 ENCOUNTER — Encounter (HOSPITAL_COMMUNITY)
Admission: RE | Disposition: A | Discharge status: Home / Self Care | Payer: Self-pay | Source: Ambulatory Visit | Attending: Obstetrics & Gynecology

## 2012-12-26 DIAGNOSIS — O00109 Unspecified tubal pregnancy without intrauterine pregnancy: Principal | ICD-10-CM

## 2012-12-26 DIAGNOSIS — K929 Disease of digestive system, unspecified: Secondary | ICD-10-CM | POA: Diagnosis not present

## 2012-12-26 DIAGNOSIS — S01512A Laceration without foreign body of oral cavity, initial encounter: Secondary | ICD-10-CM

## 2012-12-26 DIAGNOSIS — D649 Anemia, unspecified: Secondary | ICD-10-CM | POA: Diagnosis present

## 2012-12-26 DIAGNOSIS — K560 Paralytic ileus: Secondary | ICD-10-CM | POA: Diagnosis not present

## 2012-12-26 DIAGNOSIS — D259 Leiomyoma of uterus, unspecified: Secondary | ICD-10-CM | POA: Diagnosis present

## 2012-12-26 DIAGNOSIS — D219 Benign neoplasm of connective and other soft tissue, unspecified: Secondary | ICD-10-CM | POA: Diagnosis present

## 2012-12-26 DIAGNOSIS — Z5331 Laparoscopic surgical procedure converted to open procedure: Secondary | ICD-10-CM

## 2012-12-26 DIAGNOSIS — Y838 Other surgical procedures as the cause of abnormal reaction of the patient, or of later complication, without mention of misadventure at the time of the procedure: Secondary | ICD-10-CM | POA: Diagnosis not present

## 2012-12-26 DIAGNOSIS — Y9210 Unspecified residential institution as the place of occurrence of the external cause: Secondary | ICD-10-CM | POA: Diagnosis not present

## 2012-12-26 DIAGNOSIS — O009 Unspecified ectopic pregnancy without intrauterine pregnancy: Secondary | ICD-10-CM | POA: Diagnosis present

## 2012-12-26 DIAGNOSIS — S01502A Unspecified open wound of oral cavity, initial encounter: Secondary | ICD-10-CM | POA: Diagnosis not present

## 2012-12-26 HISTORY — PX: LAPAROSCOPY: SHX197

## 2012-12-26 HISTORY — PX: LAPAROTOMY: SHX154

## 2012-12-26 HISTORY — PX: UNILATERAL SALPINGECTOMY: SHX6160

## 2012-12-26 LAB — CBC
HCT: 29.7 % — ABNORMAL LOW (ref 36.0–46.0)
Hemoglobin: 10 g/dL — ABNORMAL LOW (ref 12.0–15.0)
MCH: 27.5 pg (ref 26.0–34.0)
MCHC: 33.7 g/dL (ref 30.0–36.0)
MCV: 81.8 fL (ref 78.0–100.0)
RDW: 14.3 % (ref 11.5–15.5)

## 2012-12-26 LAB — PREPARE RBC (CROSSMATCH)

## 2012-12-26 SURGERY — LAPAROSCOPY OPERATIVE
Anesthesia: General | Site: Abdomen | Laterality: Right | Wound class: Clean Contaminated

## 2012-12-26 MED ORDER — VASOPRESSIN 20 UNIT/ML IJ SOLN
INTRAMUSCULAR | Status: AC
  Filled 2012-12-26: qty 1

## 2012-12-26 MED ORDER — RACEPINEPHRINE HCL 2.25 % IN NEBU
INHALATION_SOLUTION | RESPIRATORY_TRACT | Status: AC
  Filled 2012-12-26: qty 0.5

## 2012-12-26 MED ORDER — HYDROMORPHONE 0.3 MG/ML IV SOLN
INTRAVENOUS | Status: DC
  Administered 2012-12-26: 19:00:00 via INTRAVENOUS
  Administered 2012-12-26 – 2012-12-27 (×2): 1.8 mg via INTRAVENOUS
  Administered 2012-12-27: 1.2 mg via INTRAVENOUS
  Filled 2012-12-26: qty 25

## 2012-12-26 MED ORDER — MIDAZOLAM HCL 2 MG/2ML IJ SOLN
INTRAMUSCULAR | Status: DC | PRN
  Administered 2012-12-26: 2 mg via INTRAVENOUS

## 2012-12-26 MED ORDER — LACTATED RINGERS IV SOLN
INTRAVENOUS | Status: DC
  Administered 2012-12-26 – 2012-12-29 (×7): via INTRAVENOUS

## 2012-12-26 MED ORDER — GLYCOPYRROLATE 0.2 MG/ML IJ SOLN
INTRAMUSCULAR | Status: DC | PRN
  Administered 2012-12-26: .6 mg via INTRAVENOUS

## 2012-12-26 MED ORDER — BUPIVACAINE HCL (PF) 0.25 % IJ SOLN
INTRAMUSCULAR | Status: AC
  Filled 2012-12-26: qty 30

## 2012-12-26 MED ORDER — ONDANSETRON HCL 4 MG/2ML IJ SOLN
INTRAMUSCULAR | Status: DC | PRN
  Administered 2012-12-26: 4 mg via INTRAVENOUS

## 2012-12-26 MED ORDER — CEFAZOLIN SODIUM-DEXTROSE 2-3 GM-% IV SOLR
2.0000 g | INTRAVENOUS | Status: AC
  Administered 2012-12-26: 2 g via INTRAVENOUS

## 2012-12-26 MED ORDER — INDIGOTINDISULFONATE SODIUM 8 MG/ML IJ SOLN
INTRAMUSCULAR | Status: AC
  Filled 2012-12-26: qty 5

## 2012-12-26 MED ORDER — FENTANYL CITRATE 0.05 MG/ML IJ SOLN
25.0000 ug | INTRAMUSCULAR | Status: DC | PRN
  Administered 2012-12-26 (×4): 50 ug via INTRAVENOUS

## 2012-12-26 MED ORDER — HYDROMORPHONE HCL PF 1 MG/ML IJ SOLN: 1.0000 mg | INTRAMUSCULAR | Status: DC | PRN

## 2012-12-26 MED ORDER — FENTANYL CITRATE 0.05 MG/ML IJ SOLN
INTRAMUSCULAR | Status: DC | PRN
  Administered 2012-12-26: 100 ug via INTRAVENOUS
  Administered 2012-12-26 (×2): 50 ug via INTRAVENOUS
  Administered 2012-12-26: 100 ug via INTRAVENOUS
  Administered 2012-12-26: 50 ug via INTRAVENOUS
  Administered 2012-12-26: 100 ug via INTRAVENOUS
  Administered 2012-12-26: 50 ug via INTRAVENOUS

## 2012-12-26 MED ORDER — ONDANSETRON HCL 4 MG/2ML IJ SOLN
INTRAMUSCULAR | Status: AC
  Filled 2012-12-26: qty 2

## 2012-12-26 MED ORDER — MIDAZOLAM HCL 2 MG/2ML IJ SOLN
INTRAMUSCULAR | Status: AC
  Filled 2012-12-26: qty 2

## 2012-12-26 MED ORDER — FENTANYL CITRATE 0.05 MG/ML IJ SOLN
INTRAMUSCULAR | Status: AC
  Filled 2012-12-26: qty 5

## 2012-12-26 MED ORDER — ONDANSETRON HCL 4 MG/2ML IJ SOLN: 4.0000 mg | Freq: Once | INTRAMUSCULAR | Status: DC | PRN

## 2012-12-26 MED ORDER — SODIUM CHLORIDE 0.9 % IJ SOLN: 9.0000 mL | INTRAMUSCULAR | Status: DC | PRN

## 2012-12-26 MED ORDER — PROPOFOL 10 MG/ML IV BOLUS
INTRAVENOUS | Status: DC | PRN
  Administered 2012-12-26: 100 mg via INTRAVENOUS
  Administered 2012-12-26: 50 mg via INTRAVENOUS
  Administered 2012-12-26: 150 mg via INTRAVENOUS

## 2012-12-26 MED ORDER — LACTATED RINGERS IR SOLN
Status: DC | PRN
  Administered 2012-12-26: 3000 mL

## 2012-12-26 MED ORDER — DEXAMETHASONE SODIUM PHOSPHATE 10 MG/ML IJ SOLN
INTRAMUSCULAR | Status: DC | PRN
  Administered 2012-12-26: 10 mg via INTRAVENOUS

## 2012-12-26 MED ORDER — SODIUM CHLORIDE 0.9 % IJ SOLN
INTRAMUSCULAR | Status: AC
  Filled 2012-12-26: qty 50

## 2012-12-26 MED ORDER — ONDANSETRON HCL 4 MG/2ML IJ SOLN: 4.0000 mg | Freq: Four times a day (QID) | INTRAMUSCULAR | Status: DC | PRN

## 2012-12-26 MED ORDER — RACEPINEPHRINE HCL 2.25 % IN NEBU: 0.5000 mL | INHALATION_SOLUTION | Freq: Once | RESPIRATORY_TRACT | Status: DC

## 2012-12-26 MED ORDER — HYDROMORPHONE HCL PF 1 MG/ML IJ SOLN
INTRAMUSCULAR | Status: AC
  Filled 2012-12-26: qty 1

## 2012-12-26 MED ORDER — MEPERIDINE HCL 25 MG/ML IJ SOLN: 6.2500 mg | INTRAMUSCULAR | Status: DC | PRN

## 2012-12-26 MED ORDER — FENTANYL CITRATE 0.05 MG/ML IJ SOLN
INTRAMUSCULAR | Status: AC
  Administered 2012-12-26: 50 ug via INTRAVENOUS
  Filled 2012-12-26: qty 2

## 2012-12-26 MED ORDER — ROCURONIUM BROMIDE 100 MG/10ML IV SOLN
INTRAVENOUS | Status: DC | PRN
  Administered 2012-12-26: 40 mg via INTRAVENOUS
  Administered 2012-12-26 (×3): 10 mg via INTRAVENOUS

## 2012-12-26 MED ORDER — LIDOCAINE 5 % EX OINT
TOPICAL_OINTMENT | CUTANEOUS | Status: AC
  Filled 2012-12-26: qty 35.44

## 2012-12-26 MED ORDER — PROPOFOL 10 MG/ML IV EMUL
INTRAVENOUS | Status: AC
  Filled 2012-12-26: qty 60

## 2012-12-26 MED ORDER — DIPHENHYDRAMINE HCL 12.5 MG/5ML PO ELIX: 12.5000 mg | ORAL_SOLUTION | Freq: Four times a day (QID) | ORAL | Status: DC | PRN

## 2012-12-26 MED ORDER — HYDROMORPHONE HCL PF 1 MG/ML IJ SOLN
0.2500 mg | INTRAMUSCULAR | Status: DC | PRN
  Administered 2012-12-26 (×4): 0.5 mg via INTRAVENOUS

## 2012-12-26 MED ORDER — SODIUM CHLORIDE 0.9 % IV SOLN
INTRAVENOUS | Status: DC | PRN
  Administered 2012-12-26: 15:00:00 via INTRAMUSCULAR

## 2012-12-26 MED ORDER — HYDROMORPHONE HCL PF 1 MG/ML IJ SOLN
INTRAMUSCULAR | Status: AC
  Administered 2012-12-26: 0.5 mg via INTRAVENOUS
  Filled 2012-12-26: qty 1

## 2012-12-26 MED ORDER — GLYCOPYRROLATE 0.2 MG/ML IJ SOLN
INTRAMUSCULAR | Status: AC
  Filled 2012-12-26: qty 3

## 2012-12-26 MED ORDER — DIPHENHYDRAMINE HCL 50 MG/ML IJ SOLN: 12.5000 mg | Freq: Four times a day (QID) | INTRAMUSCULAR | Status: DC | PRN

## 2012-12-26 MED ORDER — NEOSTIGMINE METHYLSULFATE 1 MG/ML IJ SOLN
INTRAMUSCULAR | Status: DC | PRN
  Administered 2012-12-26: 3 mg via INTRAVENOUS

## 2012-12-26 MED ORDER — KETOROLAC TROMETHAMINE 30 MG/ML IJ SOLN: 15.0000 mg | Freq: Once | INTRAMUSCULAR | Status: DC | PRN

## 2012-12-26 MED ORDER — LIDOCAINE HCL 2 % EX GEL
CUTANEOUS | Status: AC
  Filled 2012-12-26: qty 5

## 2012-12-26 MED ORDER — NALOXONE HCL 0.4 MG/ML IJ SOLN: 0.4000 mg | INTRAMUSCULAR | Status: DC | PRN

## 2012-12-26 MED ORDER — LACTATED RINGERS IV SOLN
INTRAVENOUS | Status: DC
  Administered 2012-12-26 (×5): via INTRAVENOUS

## 2012-12-26 MED ORDER — BUPIVACAINE HCL 0.25 % IJ SOLN
INTRAMUSCULAR | Status: DC | PRN
  Administered 2012-12-26: 7 mL

## 2012-12-26 MED ORDER — CEFAZOLIN SODIUM-DEXTROSE 2-3 GM-% IV SOLR
INTRAVENOUS | Status: AC
  Filled 2012-12-26: qty 50

## 2012-12-26 MED ORDER — HEPARIN SODIUM (PORCINE) 5000 UNIT/ML IJ SOLN
INTRAMUSCULAR | Status: AC
  Filled 2012-12-26: qty 1

## 2012-12-26 MED ORDER — NEOSTIGMINE METHYLSULFATE 1 MG/ML IJ SOLN
INTRAMUSCULAR | Status: AC
  Filled 2012-12-26: qty 1

## 2012-12-26 MED ORDER — DEXAMETHASONE SODIUM PHOSPHATE 10 MG/ML IJ SOLN
INTRAMUSCULAR | Status: AC
  Filled 2012-12-26: qty 1

## 2012-12-26 MED ORDER — HYDROCODONE-ACETAMINOPHEN 5-325 MG PO TABS
1.0000 | ORAL_TABLET | ORAL | Status: DC | PRN
  Administered 2012-12-30: 1 via ORAL
  Filled 2012-12-26: qty 1

## 2012-12-26 SURGICAL SUPPLY — 36 items
CANISTER SUCTION 2500CC (MISCELLANEOUS) ×4 IMPLANT
CHLORAPREP W/TINT 26ML (MISCELLANEOUS) ×4 IMPLANT
CLOTH BEACON ORANGE TIMEOUT ST (SAFETY) ×4 IMPLANT
DERMABOND ADVANCED (GAUZE/BANDAGES/DRESSINGS) ×1
DERMABOND ADVANCED .7 DNX12 (GAUZE/BANDAGES/DRESSINGS) ×3 IMPLANT
DRSG OPSITE POSTOP 4X10 (GAUZE/BANDAGES/DRESSINGS) ×4 IMPLANT
ELECT LIGASURE SHORT 9 REUSE (ELECTRODE) ×4 IMPLANT
ELECT REM PT RETURN 9FT ADLT (ELECTROSURGICAL) ×4
ELECTRODE REM PT RTRN 9FT ADLT (ELECTROSURGICAL) ×3 IMPLANT
EVACUATOR SMOKE 8.L (FILTER) ×4 IMPLANT
GLOVE BIO SURGEON STRL SZ8 (GLOVE) ×4 IMPLANT
GLOVE BIOGEL PI IND STRL 8.5 (GLOVE) ×3 IMPLANT
GLOVE BIOGEL PI INDICATOR 8.5 (GLOVE) ×1
GOWN PREVENTION PLUS LG XLONG (DISPOSABLE) ×20 IMPLANT
MANIPULATOR UTERINE 4.5 ZUMI (MISCELLANEOUS) ×4 IMPLANT
NEEDLE INSUFFLATION 120MM (ENDOMECHANICALS) ×4 IMPLANT
PACK ABDOMINAL GYN (CUSTOM PROCEDURE TRAY) ×4 IMPLANT
PACK LAPAROSCOPY BASIN (CUSTOM PROCEDURE TRAY) ×4 IMPLANT
PROTECTOR NERVE ULNAR (MISCELLANEOUS) ×8 IMPLANT
SCALPEL HARMONIC ACE (MISCELLANEOUS) ×4 IMPLANT
SET IRRIG TUBING LAPAROSCOPIC (IRRIGATION / IRRIGATOR) ×4 IMPLANT
SUT MNCRL AB 4-0 PS2 18 (SUTURE) ×4 IMPLANT
SUT VIC AB 0 CT1 18XCR BRD8 (SUTURE) ×3 IMPLANT
SUT VIC AB 0 CT1 27 (SUTURE) ×2
SUT VIC AB 0 CT1 27XBRD ANBCTR (SUTURE) ×6 IMPLANT
SUT VIC AB 0 CT1 8-18 (SUTURE) ×3
SUT VIC AB 3-0 SH 27 (SUTURE) ×6
SUT VIC AB 3-0 SH 27XBRD (SUTURE) ×6 IMPLANT
SUT VICRYL 0 TIES 12 18 (SUTURE) ×4 IMPLANT
TOWEL OR 17X24 6PK STRL BLUE (TOWEL DISPOSABLE) ×16 IMPLANT
TRAY FOLEY CATH 14FR (SET/KITS/TRAYS/PACK) ×4 IMPLANT
TROCAR OPTI TIP 5M 100M (ENDOMECHANICALS) ×4 IMPLANT
TROCAR XCEL 12X100 BLDLESS (ENDOMECHANICALS) ×4 IMPLANT
TROCAR XCEL DIL TIP R 11M (ENDOMECHANICALS) ×4 IMPLANT
WARMER LAPAROSCOPE (MISCELLANEOUS) ×4 IMPLANT
WATER STERILE IRR 1000ML POUR (IV SOLUTION) ×4 IMPLANT

## 2012-12-26 NOTE — Transfer of Care (Signed)
Immediate Anesthesia Transfer of Care Note  Patient: Dominique Jones  Procedure(s) Performed: Procedure(s) with comments: LAPAROSCOPY OPERATIVE REMOVAL OF ECTOPIC PREGNANCY (N/A) - Attempted LAPAROTOMY (N/A) UNILATERAL SALPINGECTOMY (Right)  Patient Location: PACU  Anesthesia Type:General  Level of Consciousness: awake, alert  and oriented  Airway & Oxygen Therapy: Patient Spontanous Breathing and Patient connected to face mask oxygen  Post-op Assessment: Report given to PACU RN, Post -op Vital signs reviewed and stable and Patient moving all extremities  Post vital signs: Reviewed and stable  Complications: soft tissue trauma

## 2012-12-26 NOTE — Consult Note (Signed)
ENT CONSULT:  Reason for Consult: Oral Hemorrhage on Intubation Referring Physician: Anesthesia  Dominique Jones is an 27 y.o. female.  HPI: The patient is a 27 year old black female admitted for surgical treatment of ectopic pregnancy. No prior airway issues, hoarseness, sore throat or difficulty breathing by anesthesia report. The patient was difficult to intubate and after several attempts a glide scope was used, the patient was intubated and the airway secured using a Seldinger over an airway dilator. No respiratory or airway issues. The patient developed acute oropharyngeal hemorrhage, no aspiration of blood.  Past Medical History  Diagnosis Date  . Glaucoma     follows up with opthalmologist once a year for pressure check    Past Surgical History  Procedure Laterality Date  . Refractive surgery    . Operative ultrasound Right 12/14/2012    Procedure: OPERATIVE ULTRASOUND GUIDED INJECTION OF POTASSIUM CHLORIDE 2.5ML;  Surgeon: Fermin Schwab, MD;  Location: [facility];  Service: Gynecology;  Laterality: Right;    No family history on file.  Social History:  reports that she has never smoked. She does not have any smokeless tobacco history on file. She reports that she drinks alcohol. She reports that she does not use illicit drugs.  Allergies: No Known Allergies  Medications: I have reviewed the patient's current medications.  Results for orders placed during the hospital encounter of 12/26/12 (from the past 48 hour(s))  CBC     Status: Abnormal   Collection Time    12/26/12 12:20 PM      Result Value Range   WBC 5.7  4.0 - 10.5 K/uL   RBC 3.63 (*) 3.87 - 5.11 MIL/uL   Hemoglobin 10.0 (*) 12.0 - 15.0 g/dL   HCT 11.9 (*) 14.7 - 82.9 %   MCV 81.8  78.0 - 100.0 fL   MCH 27.5  26.0 - 34.0 pg   MCHC 33.7  30.0 - 36.0 g/dL   RDW 56.2  13.0 - 86.5 %   Platelets 315  150 - 400 K/uL  TYPE AND SCREEN     Status: None   Collection Time    12/26/12 12:50 PM     Result Value Range   ABO/RH(D) A POS     Antibody Screen NEG     Sample Expiration 12/29/2012     Unit Number [id number]     Blood Component Type RED CELLS,LR     Unit division 00     Status of Unit ALLOCATED     Transfusion Status OK TO TRANSFUSE     Crossmatch Result Compatible     Unit Number [id number]     Blood Component Type RED CELLS,LR     Unit division 00     Status of Unit ALLOCATED     Transfusion Status OK TO TRANSFUSE     Crossmatch Result Compatible    PREPARE RBC (CROSSMATCH)     Status: None   Collection Time    12/26/12  2:30 PM      Result Value Range   Order Confirmation ORDER PROCESSED BY BLOOD BANK      No results found.  ROS:ROS  Blood pressure 120/79, pulse 67, temperature 99.1 F (37.3 C), temperature source Oral, resp. rate 18, last menstrual period 10/05/2012, SpO2 100.00%.  PHYSICAL EXAM: The patient is under general anesthesia and orally intubated. Direct laryngoscopy performed under anesthesia using a Miller blade laryngoscope. The patient has a 2 cm laceration at the right anterior tonsillar pillar extending  along the margin of the soft palate, minimal active bleeding. Moderate edema of the palate and uvula, no evidence of hemorrhage or hematoma. 2+ tonsils without evidence of infection. Base of tongue, supraglottis, larynx and hypopharynx appear normal with endotracheal tube in place, no evidence of further trauma or bleeding.  Studies Reviewed: None  Assessment/Plan: Findings on laryngoscopy are consistent with laceration of left soft palate/peritonsillar region on difficult intubation. Based airway secure without airway compromise or complication. Minimal bleeding at this time, minimal swelling. Patient appears stable for extubation at the conclusion of the ongoing surgical procedure. Recommend liquid and soft diet as tolerated, oral antibiotic therapy-Augmentin 400 mg per 5 cc 2 teaspoons by mouth twice a day for 10 days and appropriate  liquid pain medication as tolerated. Recommend followup as an outpatient in 2 weeks for recheck or sooner as needed if any concerns. Please reconsult immediately if any airway issues on extubation or postoperatively.  Katharyn Schauer 12/26/2012, 3:57 PM

## 2012-12-26 NOTE — Anesthesia Procedure Notes (Addendum)
Procedure Name: Intubation Date/Time: 12/26/2012 2:44 PM Performed by: Shanon Payor Pre-anesthesia Checklist: Patient identified, Emergency Drugs available, Suction available, Patient being monitored and Timeout performed Patient Re-evaluated:Patient Re-evaluated prior to inductionOxygen Delivery Method: Circle system utilized Preoxygenation: Pre-oxygenation with 100% oxygen Intubation Type: IV induction Ventilation: Mask ventilation without difficulty and Oral airway inserted - appropriate to patient size Laryngoscope Size: Mac and 3 Grade View: Grade IV Tube type: Oral Tube size: 7.0 mm Number of attempts: 5 or more Airway Equipment and Method: Bougie stylet,  Stylet,  Video-laryngoscopy and Oral airway Placement Confirmation: positive ETCO2,  breath sounds checked- equal and bilateral and ETT inserted through vocal cords under direct vision Secured at: 22 cm Tube secured with: Tape Dental Injury: Teeth and Oropharynx as per pre-operative assessment and Bloody posterior oropharynx  Difficulty Due To: Difficulty was unanticipated, Difficult Airway- due to anterior larynx, Difficult Airway- due to large tongue and Difficult Airway- due to reduced neck mobility Future Recommendations: Recommend- awake intubation    ENT consultation was called at 15:19 due to a structure near the base of the tongue on the left side that cannot be identified. The consultant Dr. Annalee Genta was asked to help identify the structure, determine if it was safe to extubate the patient, and advise if further measures were necessary. I was present and agree with the note above.  Dr. Arby Barrette

## 2012-12-26 NOTE — Anesthesia Postprocedure Evaluation (Signed)
Anesthesia Post Note  Patient: Dominique Jones  Procedure(s) Performed: Procedure(s) (LRB): LAPAROSCOPY OPERATIVE REMOVAL OF ECTOPIC PREGNANCY (N/A) LAPAROTOMY (N/A) UNILATERAL SALPINGECTOMY (Right)  Anesthesia type: General  Patient location: PACU  Post pain: Pain level controlled  Post assessment: Post-op Vital signs reviewed  Last Vitals:  Filed Vitals:   12/26/12 1700  BP: 130/71  Pulse: 70  Temp:   Resp: 19    Post vital signs: Reviewed  Level of consciousness: sedated  Complications: No apparent anesthesia complications except for the already noted unanticipated difficult intubation and the subsequent small soft tissue laceration in the left tonsillar pillar for which the patient has no complaints.

## 2012-12-26 NOTE — Anesthesia Preprocedure Evaluation (Signed)
Anesthesia Evaluation  Patient identified by MRN, date of birth, ID band Patient awake    Reviewed: Allergy & Precautions, H&P , NPO status , Patient's Chart, lab work & pertinent test results  Airway Mallampati: I TM Distance: >3 FB Neck ROM: full    Dental no notable dental hx. (+) Teeth Intact   Pulmonary neg pulmonary ROS,          Cardiovascular negative cardio ROS      Neuro/Psych negative neurological ROS  negative psych ROS   GI/Hepatic negative GI ROS, Neg liver ROS,   Endo/Other  negative endocrine ROS  Renal/GU negative Renal ROS  negative genitourinary   Musculoskeletal negative musculoskeletal ROS (+)   Abdominal Normal abdominal exam  (+)   Peds  Hematology negative hematology ROS (+)   Anesthesia Other Findings   Reproductive/Obstetrics negative OB ROS                           Anesthesia Physical Anesthesia Plan  ASA: II  Anesthesia Plan: General   Post-op Pain Management:    Induction: Intravenous  Airway Management Planned: Oral ETT  Additional Equipment:   Intra-op Plan:   Post-operative Plan: Extubation in OR  Informed Consent: I have reviewed the patients History and Physical, chart, labs and discussed the procedure including the risks, benefits and alternatives for the proposed anesthesia with the patient or authorized representative who has indicated his/her understanding and acceptance.   Dental Advisory Given  Plan Discussed with: CRNA and Surgeon  Anesthesia Plan Comments:         Anesthesia Quick Evaluation  

## 2012-12-26 NOTE — H&P (Signed)
Dominique Jones is an 27 y.o. female. She was diagnosed 10 d ago with an 11wk size extrauterine pregnancy s symptoms and received systemic MTX x 1 dose, after undergoing feticide with TVUS guided KCl injection. She has continued to remain asymptomatic. She started bleeding yesterday. Of note, patient was experiencing delaying conceiving prior to this conception but did not undergo any infertility workup. Last ultrasound also showed a very large posterior transmural leiomyoma. Patient wishes to preserve her childbearing potential.  Pertinent Gynecological History: Menses: flow is moderate Bleeding: none Contraception: none DES exposure: denies Blood transfusions: none Sexually transmitted diseases: no past history Previous GYN Procedures: none  Last mammogram: none  Last pap: normal  OB History   Grav Para Term Preterm Abortions TAB SAB Ect Mult Living   1                Menstrual History: Menarche age: 89 Patient's last menstrual period was 10/05/2012.    Past Medical History  Diagnosis Date  . Glaucoma     follows up with opthalmologist once a year for pressure check    Past Surgical History  Procedure Laterality Date  . Refractive surgery    . Operative ultrasound Right 12/14/2012    Procedure: OPERATIVE ULTRASOUND GUIDED INJECTION OF POTASSIUM CHLORIDE 2.5ML;  Surgeon: Fermin Schwab, MD;  Location: [facility];  Service: Gynecology;  Laterality: Right;    No family history on file.  Social History:  reports that she has never smoked. She does not have any smokeless tobacco history on file. She reports that she drinks alcohol. She reports that she does not use illicit drugs.  Allergies: No Known Allergies  Prescriptions prior to admission  Medication Sig Dispense Refill  . ferrous sulfate (FERROUSUL) 325 (65 FE) MG tablet Take 1 tablet (325 mg total) by mouth 2 (two) times daily.  60 tablet  1  . ondansetron (ZOFRAN) 4 MG tablet Take 1 tablet (4 mg  total) by mouth every 6 (six) hours as needed for nausea.  20 tablet  0    Review of Systems  Constitutional: Negative.   HENT: Negative.   Eyes: Negative.   Respiratory: Negative.   Cardiovascular: Negative.   Gastrointestinal: Positive for constipation.  Musculoskeletal: Negative.   Neurological: Negative.   Endo/Heme/Allergies: Negative.   Psychiatric/Behavioral: Negative.     Blood pressure 120/79, pulse 67, temperature 99.1 F (37.3 C), temperature source Oral, resp. rate 18, last menstrual period 10/05/2012, SpO2 100.00%. Physical Exam Constitutional: She is oriented to person, place, and time. She appears well-developed and well-nourished.  HENT:  Head: Normocephalic and atraumatic.  Nose: Nose normal.  Mouth/Throat: Oropharynx is clear and moist. No oropharyngeal exudate.  Eyes: Conjunctivae normal and EOM are normal. Pupils are equal, round, and reactive to light. No scleral icterus.  Neck: Normal range of motion. Neck supple. No tracheal deviation present. No thyromegaly present.  Cardiovascular: Normal rate.   Respiratory: Effort normal and breath sounds normal.  GI: Soft. Bowel sounds are normal. She exhibits no distension and no mass. There is no tenderness.  Lymphadenopathy:    She has no cervical adenopathy.  Neurological: She is alert and oriented to person, place, and time. She has normal reflexes.  Skin: Skin is warm.  Psychiatric: She has a normal mood and affect. Her behavior is normal. Judgment and thought content normal.   Results for orders placed during the hospital encounter of 12/26/12 (from the past 24 hour(s))  CBC     Status:  Abnormal   Collection Time    12/26/12 12:20 PM      Result Value Range   WBC 5.7  4.0 - 10.5 K/uL   RBC 3.63 (*) 3.87 - 5.11 MIL/uL   Hemoglobin 10.0 (*) 12.0 - 15.0 g/dL   HCT 40.9 (*) 81.1 - 91.4 %   MCV 81.8  78.0 - 100.0 fL   MCH 27.5  26.0 - 34.0 pg   MCHC 33.7  30.0 - 36.0 g/dL   RDW 78.2  95.6 - 21.3 %    Platelets 315  150 - 400 K/uL    No results found.  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 She understands the benefits, risks and long-term consequences of the procedure such as future infertility. We discussed possible need for blood transfusion intraoperatively. 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 She also understands that she will need myomectomy in 6 weeks or later, which I will be glad to schedule.  Yates Decamp 12/26/2012, 1:38 PM

## 2012-12-27 ENCOUNTER — Encounter (HOSPITAL_COMMUNITY): Payer: Self-pay | Admitting: Obstetrics and Gynecology

## 2012-12-27 LAB — CBC
Hemoglobin: 9.2 g/dL — ABNORMAL LOW (ref 12.0–15.0)
MCH: 27.6 pg (ref 26.0–34.0)
MCHC: 34.1 g/dL (ref 30.0–36.0)
Platelets: 308 10*3/uL (ref 150–400)
RBC: 3.33 MIL/uL — ABNORMAL LOW (ref 3.87–5.11)
WBC: 16.8 10*3/uL — ABNORMAL HIGH (ref 4.0–10.5)

## 2012-12-27 MED ORDER — ONDANSETRON HCL 4 MG/2ML IJ SOLN
4.0000 mg | Freq: Four times a day (QID) | INTRAMUSCULAR | Status: DC | PRN
  Administered 2012-12-28: 4 mg via INTRAVENOUS
  Filled 2012-12-27: qty 2

## 2012-12-27 MED ORDER — ENSURE COMPLETE PO LIQD
237.0000 mL | Freq: Two times a day (BID) | ORAL | Status: DC
  Administered 2012-12-27 – 2012-12-30 (×3): 237 mL via ORAL
  Filled 2012-12-27 (×7): qty 237

## 2012-12-27 MED ORDER — OXYCODONE HCL 5 MG/5ML PO SOLN
5.0000 mg | ORAL | Status: DC | PRN
  Administered 2012-12-27 – 2012-12-28 (×5): 5 mg via ORAL
  Filled 2012-12-27 (×5): qty 5

## 2012-12-27 MED ORDER — AMOXICILLIN-POT CLAVULANATE 400-57 MG/5ML PO SUSR
400.0000 mg | Freq: Two times a day (BID) | ORAL | Status: DC
  Filled 2012-12-27: qty 5

## 2012-12-27 MED ORDER — AMOXICILLIN-POT CLAVULANATE 200-28.5 MG/5ML PO SUSR
800.0000 mg | Freq: Two times a day (BID) | ORAL | Status: DC
  Administered 2012-12-27 – 2012-12-28 (×3): 800 mg via ORAL
  Filled 2012-12-27 (×5): qty 20

## 2012-12-27 MED ORDER — IBUPROFEN 100 MG/5ML PO SUSP
600.0000 mg | Freq: Four times a day (QID) | ORAL | Status: DC | PRN
  Administered 2012-12-27 – 2012-12-28 (×3): 600 mg via ORAL
  Filled 2012-12-27 (×3): qty 30

## 2012-12-27 MED FILL — Heparin Sodium (Porcine) Inj 5000 Unit/ML: INTRAMUSCULAR | Qty: 1 | Status: AC

## 2012-12-27 NOTE — Progress Notes (Signed)
Nutrition: Pt with difficulty swallowing, ( reported difficult intubation) Discussed soft menu options. Ensure ordered BID. Boost or Boost Cleotis Nipper is an option after discharge to supplement.  Elisabeth Cara M.Odis Luster LDN Neonatal Nutrition Support Specialist Pager [phone]

## 2012-12-27 NOTE — Op Note (Signed)
Dominique Jones, PROBASCO NO.:  [id number]  MEDICAL RECORD NO.:  [id number]  LOCATION:  9319                          FACILITY:  [facility]  PHYSICIAN:  Kinnie Scales. Annalee Genta, M.D.DATE OF BIRTH:  1985/07/28  DATE OF PROCEDURE:  12/26/2012 DATE OF DISCHARGE:                              OPERATIVE REPORT   PROCEDURE:  Direct laryngoscopy.  ANESTHESIA:  General endotracheal.  COMPLICATIONS:  No complications.  BRIEF HISTORY:  The patient is a 27 year old, black female, who was undergoing surgical management of an ectopic pregnancy at [facility] under general anesthesia.  The patient had difficult intubation with some difficulty visualized in the larynx using both standard laryngoscope and a glide scope, she was safely intubated over a guidewire without complication.  The area was stable and safe and the surgical procedure was begun.  It was noted that the patient had a moderate amount of bloody mucus secretions from the oropharynx.  The endotracheal tube was then placed, and the patient's airway was safe and intact.  Emergency ENT consultation requested for evaluation of oropharyngeal hemorrhage.  The patient was evaluated under general anesthesia under an ongoing surgical procedure.  No consent was obtained.  DESCRIPTION OF PROCEDURE:  The patient was examined under anesthesia using a straight Miller laryngoscope.  There was minimal bleeding at the time of her examination.  Endotracheal tube intact and excellent airway control.  The patient was noted to have a 2-cm horizontally oriented laceration along the right anterior tonsillar pillar, extending horizontally from the anterior tonsillar pillar along the margin of the palate exposing the right tonsil.  There was minimal bleeding.  No evidence of infection or discharge.  There was some edema of the palatal margin and uvula without active hemorrhage or hematoma.  The patient had 2+ tonsils.  The remainder of her  upper airway exam was normal.  Normal base of tongue and supraglottis, normal hypopharynx and larynx.  Small amount of blood was suctioned from the hypopharynx.  Endotracheal tube in place and the posterior aspect of the laryngeal glottis appeared normal without evidence of laceration or trauma.  No further intervention was undertaken.  The patient was stable.  Plans were to complete her current surgical procedure and extubate the patient with appropriate postoperative monitoring, antibiotic therapy for oropharyngeal laceration, and appropriate pain medications.          ______________________________ Kinnie Scales Annalee Genta, M.D.     DLS/MEDQ  D:  62/13/0865  T:  12/27/2012  Job:  [id number]

## 2012-12-27 NOTE — Progress Notes (Signed)
12/27/12 1200  Clinical Encounter Type  Visited With Other (Comment) Sarajane Marek, RN; Evelina Dun, Site Mgr, Imaging Services)  Referral From Chaplain   Referred by Kathleen Argue to f/u on Ms Kopper's desire for any tangible reminders of baby, such as visual representation of baby's heartbeat.    Consulted with Bobbe Medico, RN, pt's nurse in [facility] on the day of pt's ultrasound/dx, who referred me to ultrasound.  Consulted with Evelina Dun, Site Manager, Imaging Services, for assistance.  She plans to investigate availability of such a record.  (Per chaplain, pt understands that MD permission would be needed to release information from her medical record, if any is available.)  Awaiting return call from Evelina Dun.    Spiritual Care will also follow up with pt for further spiritual and emotional support.  Please also page as needed:  [phone].  Thank you!  [address] [place], [place] [phone]

## 2012-12-27 NOTE — Progress Notes (Signed)
I received a page from RN to visit with pt.  I was familiar with her situation from when she first presented at [facility], but there had not yet been an opportunity for Spiritual Care Dept to speak with her.    Dominique Jones was tearful this morning and able to begin to process how overwhelming this has all been and how much grief and anger she has over the situation.  I helped give her space as she processed and offered a ritual for releasing some of her anger.  I also offered prayer.  She would find it very meaningful to know the sex of the baby if her physician was able to identify it.  She would also find it very meaningful to have a print out of her baby's heart beat.  She has always wanted to be a mother and is grieving deeply the loss of this baby whom she did not get to see or hold.  Anything that we are able to provide her with to carry home about this baby would be meaningful.  [employer] Pager, [phone] 11:20 AM

## 2012-12-27 NOTE — Anesthesia Postprocedure Evaluation (Signed)
  Anesthesia Post-op Note  Patient: Dominique Jones  Procedure(s) Performed: Procedure(s) with comments: LAPAROSCOPY OPERATIVE REMOVAL OF ECTOPIC PREGNANCY (N/A) - Attempted LAPAROTOMY (N/A) UNILATERAL SALPINGECTOMY (Right)  Patient Location: [facility]  Anesthesia Type:General  Level of Consciousness: awake, alert  and oriented  Airway and Oxygen Therapy: Patient Spontanous Breathing and Patient connected to nasal cannula oxygen  Post-op Pain: mild  Post-op Assessment: Post-op Vital signs reviewed, Patient's Cardiovascular Status Stable and Respiratory Function Stable  Post-op Vital Signs: Reviewed and stable  Complications: No apparent anesthesia complications

## 2012-12-27 NOTE — Progress Notes (Signed)
1 Day Post-Op Procedure(s) (LRB): LAPAROSCOPY OPERATIVE REMOVAL OF ECTOPIC PREGNANCY (N/A) LAPAROTOMY (N/A) UNILATERAL SALPINGECTOMY (Right)  Subjective: Patient reports incisional pain and throat pain. Ambulated yesterday evening. Denies chest pain, SOB, lightheadedness/dizziness    Objective: I have reviewed patient's vital signs, intake and output, medications and labs.  General: alert, cooperative and no distress Resp: clear to auscultation bilaterally Cardio: regular rate and rhythm GI: soft, appropriately tender, non distended. Incision clean/dry and intact Extremities: no edema, redness or tenderness in the calves or thighs  Assessment: s/p Procedure(s) with comments: LAPAROSCOPY OPERATIVE REMOVAL OF ECTOPIC PREGNANCY (N/A) - Attempted LAPAROTOMY (N/A) UNILATERAL SALPINGECTOMY (Right): stable  Plan: Patient hemodynamically stable Will discontinue foley and PCA pump Will start liquid oxycodone and ibuprofen for pain management Will start Augmentin as ENT recommendation who plan to follow up out patient Encourage ambulation Soft liquid diet Continue routine post-op care   LOS: 1 day    Kwynn Schlotter 12/27/2012, 9:40 AM

## 2012-12-27 NOTE — Progress Notes (Signed)
12/27/12 1400  Clinical Encounter Type  Visited With Patient and family together;Health care provider (mom)  Visit Type Follow-up;Spiritual support;Social support  Spiritual Encounters  Spiritual Needs Emotional;Sacred text (Bible per mom's request)   Followed up with Dominique Jones to offer further support and to share findings from research about her questions related to receiving a copy of baby's heartbeat.  Consultation with RNs and radiology yielded recommendation for pt to ask her physician directly.  Chaplain and RNs had already helped her begin a written list of questions for MD, to which she plans to add others as they arise.    Provided reflective listening, affirmation, encouragement to practice self-care and gentleness, brief grief education, and Bible per mom's request.  Spiritual Care will follow up again tomorrow.  [address] [place], [place] [phone]

## 2012-12-27 NOTE — Progress Notes (Signed)
UR completed 

## 2012-12-28 MED ORDER — MORPHINE SULFATE 4 MG/ML IJ SOLN
2.0000 mg | INTRAMUSCULAR | Status: DC | PRN
  Administered 2012-12-28 – 2012-12-29 (×4): 2 mg via INTRAVENOUS
  Filled 2012-12-28 (×4): qty 1

## 2012-12-28 MED ORDER — PROMETHAZINE HCL 25 MG/ML IJ SOLN
12.5000 mg | Freq: Four times a day (QID) | INTRAMUSCULAR | Status: DC | PRN
  Administered 2012-12-28 (×2): 12.5 mg via INTRAVENOUS
  Filled 2012-12-28 (×2): qty 1

## 2012-12-28 MED ORDER — ONDANSETRON 8 MG/NS 50 ML IVPB
8.0000 mg | Freq: Four times a day (QID) | INTRAVENOUS | Status: DC | PRN
  Administered 2012-12-29: 8 mg via INTRAVENOUS
  Filled 2012-12-28 (×2): qty 8

## 2012-12-28 NOTE — Progress Notes (Signed)
2 Days Post-Op Procedure(s) (LRB): LAPAROSCOPY OPERATIVE REMOVAL OF ECTOPIC PREGNANCY (N/A) LAPAROTOMY (N/A) UNILATERAL SALPINGECTOMY (Right)  Subjective: Patient reports nausea, vomiting and incisional pain.  No flatus, no emesis. Did not sleep last night.  Notes pain in mouth.  IV anti-emetics did not help.  Objective: I have reviewed patient's vital signs, intake and output, medications and labs.  General: alert, cooperative, appears stated age and fatigued GI: abnormal findings:  hypoactive bowel sounds and incision: clean, dry and intact Extremities: extremities normal, atraumatic, no cyanosis or edema  Assessment: s/p Procedure(s) with comments: LAPAROSCOPY OPERATIVE REMOVAL OF ECTOPIC PREGNANCY (N/A) - Attempted LAPAROTOMY (N/A) UNILATERAL SALPINGECTOMY (Right): not tolerating diet and ileus present  Plan: Encourage ambulation Clear liquids improve anti-nausea medications  LOS: 2 days    Dominique Jones S 12/28/2012, 9:10 AM

## 2012-12-29 ENCOUNTER — Encounter (HOSPITAL_COMMUNITY): Payer: Self-pay | Admitting: Radiology

## 2012-12-29 ENCOUNTER — Inpatient Hospital Stay (HOSPITAL_COMMUNITY): Payer: Medicaid Other

## 2012-12-29 LAB — COMPREHENSIVE METABOLIC PANEL
ALT: 6 U/L (ref 0–35)
Albumin: 2.5 g/dL — ABNORMAL LOW (ref 3.5–5.2)
CO2: 27 mEq/L (ref 19–32)
Calcium: 8.5 mg/dL (ref 8.4–10.5)
Chloride: 102 mEq/L (ref 96–112)
Creatinine, Ser: 0.57 mg/dL (ref 0.50–1.10)
GFR calc Af Amer: >90 mL/min (ref >90)
GFR calc non Af Amer: >90 mL/min (ref >90)
Glucose, Bld: 92 mg/dL (ref 70–99)
Sodium: 139 mEq/L (ref 135–145)

## 2012-12-29 LAB — CBC WITH DIFFERENTIAL/PLATELET
Basophils Relative: 0 % (ref 0–1)
Hemoglobin: 8.6 g/dL — ABNORMAL LOW (ref 12.0–15.0)
Lymphs Abs: 2.1 10*3/uL (ref 0.7–4.0)
MCH: 27.2 pg (ref 26.0–34.0)
MCHC: 33.2 g/dL (ref 30.0–36.0)
Monocytes Relative: 6 % (ref 3–12)
Neutro Abs: 8.2 10*3/uL — ABNORMAL HIGH (ref 1.7–7.7)
Neutrophils Relative %: 75 % (ref 43–77)
Platelets: 328 10*3/uL (ref 150–400)
RBC: 3.16 MIL/uL — ABNORMAL LOW (ref 3.87–5.11)
RDW: 14.5 % (ref 11.5–15.5)

## 2012-12-29 MED ORDER — KETOROLAC TROMETHAMINE 30 MG/ML IJ SOLN
30.0000 mg | Freq: Four times a day (QID) | INTRAMUSCULAR | Status: AC
  Administered 2012-12-29 – 2012-12-30 (×5): 30 mg via INTRAVENOUS
  Filled 2012-12-29 (×5): qty 1

## 2012-12-29 MED ORDER — SODIUM CHLORIDE 0.9 % IV SOLN
3.0000 g | Freq: Four times a day (QID) | INTRAVENOUS | Status: DC
  Administered 2012-12-29 – 2012-12-30 (×6): 3 g via INTRAVENOUS
  Filled 2012-12-29 (×8): qty 3

## 2012-12-29 MED ORDER — DEXTROSE IN LACTATED RINGERS 5 % IV SOLN
INTRAVENOUS | Status: DC
  Administered 2012-12-29 – 2012-12-30 (×3): via INTRAVENOUS

## 2012-12-29 NOTE — Progress Notes (Signed)
3 Days Post-Op Procedure(s) (LRB): LAPAROSCOPY OPERATIVE REMOVAL OF ECTOPIC PREGNANCY (N/A) LAPAROTOMY (N/A) UNILATERAL SALPINGECTOMY (Right)  Subjective: Patient reports nausea, vomiting, incisional pain and + flatus.    Objective: I have reviewed patient's vital signs, intake and output, medications and labs.  General: alert, cooperative and appears stated age Resp: clear to auscultation bilaterally Cardio: regular rate and rhythm, S1, S2 normal, no murmur, click, rub or gallop GI: abnormal findings:  distended and hypoactive bowel sounds Extremities: extremities normal, atraumatic, no cyanosis or edema Vaginal Bleeding: moderate  Assessment: s/p Procedure(s) with comments: LAPAROSCOPY OPERATIVE REMOVAL OF ECTOPIC PREGNANCY (N/A) - Attempted LAPAROTOMY (N/A) UNILATERAL SALPINGECTOMY (Right): not tolerating diet and ileus present  Plan: Encourage ambulation NPO Check abd. x-ray Check labs IV mediations and fluids.  LOS: 3 days    Geraldin Habermehl S 12/29/2012, 7:36 AM

## 2012-12-30 DIAGNOSIS — S01512A Laceration without foreign body of oral cavity, initial encounter: Secondary | ICD-10-CM

## 2012-12-30 DIAGNOSIS — D219 Benign neoplasm of connective and other soft tissue, unspecified: Secondary | ICD-10-CM | POA: Diagnosis present

## 2012-12-30 DIAGNOSIS — O00109 Unspecified tubal pregnancy without intrauterine pregnancy: Secondary | ICD-10-CM

## 2012-12-30 DIAGNOSIS — D649 Anemia, unspecified: Secondary | ICD-10-CM | POA: Diagnosis present

## 2012-12-30 DIAGNOSIS — O008 Other ectopic pregnancy without intrauterine pregnancy: Secondary | ICD-10-CM

## 2012-12-30 DIAGNOSIS — D259 Leiomyoma of uterus, unspecified: Secondary | ICD-10-CM

## 2012-12-30 LAB — TYPE AND SCREEN
Unit division: 0
Unit division: 0

## 2012-12-30 MED ORDER — AMOXICILLIN-POT CLAVULANATE 400-57 MG/5ML PO SUSR: 800.0000 mg | Freq: Two times a day (BID) | ORAL | Status: DC

## 2012-12-30 MED ORDER — OXYCODONE-ACETAMINOPHEN 5-325 MG PO TABS: 1.0000 | ORAL_TABLET | ORAL | Status: DC | PRN

## 2012-12-30 NOTE — Discharge Summary (Signed)
Physician Discharge Summary  Patient ID: Dominique Jones MRN: [id number] DOB/AGE: [birth year] 27 y.o.  Admit date: 12/26/2012 Discharge date: 12/30/2012  Admission Diagnoses:  Ectopic pregnancy, fibroid tumors, anemia  Discharge Diagnoses: S/p surgical removal of ectopic pregnancy,  Active Problems:   Fibroids   Anemia   Laceration of palate   Discharged Condition: good  Hospital Course: 27 yo female admitted for surgical resection of ectopic pregnancy.  Pt had undergone KCL injection and methotrexate to devascularize the pregnancy given its exact location was not known.  Pt lad laparoscopy converted to laparotomy due to large fibroids and inability to access the posterior cul de sac where the ectopic pregnancy was located.  Pt was an extremely difficult intubation and had a soft palate / tonsillar laceration during the intubation.  Intraop ENT consult was obtained (see chart). Pt experienced an ileus and remained NPO until BS and flatus occurred.  Pt tolerated a normal diet and had a bowel movement today and is stable for discharge.  Pt has follow up with Dr. [place] Bolder in several days.  She will be scheduled for a myomectomy in 6 weeks.  Consults: ENT  Significant Diagnostic Studies: radiology: KUB: air fluid levels  Treatments: antibiotics: Unasyn  Discharge Exam: See progress note from earlier today +BS on discharge  Disposition: 01-Home or Self Care     Medication List         amoxicillin-clavulanate 400-57 MG/5ML suspension  Commonly known as:  AUGMENTIN  Take 10 mLs (800 mg total) by mouth 2 (two) times daily.     ferrous sulfate 325 (65 FE) MG tablet  Commonly known as:  FERROUSUL  Take 1 tablet (325 mg total) by mouth 2 (two) times daily.     ondansetron 4 MG tablet  Commonly known as:  ZOFRAN  Take 1 tablet (4 mg total) by mouth every 6 (six) hours as needed for nausea.     oxyCODONE-acetaminophen 5-325 MG per tablet  Commonly known as:  ROXICET  Take 1-2  tablets by mouth every 4 (four) hours as needed for severe pain.         SignedLesly Dukes. 12/30/2012, 6:39 PM

## 2012-12-30 NOTE — Progress Notes (Signed)
4 Days Post-Op Procedure(s) (LRB): LAPAROSCOPY OPERATIVE REMOVAL OF ECTOPIC PREGNANCY (N/A) LAPAROTOMY (N/A) UNILATERAL SALPINGECTOMY (Right)  Subjective: Patient reports nausea after clears last night.  She has not had solids today.  Pt reports small amount of flatus.  Objective: I have reviewed patient's vital signs, medications and labs.  General: alert, cooperative and no distress GI: soft, mildly distended with gas, moderately tender, no BS Extremities: Homans sign is negative, no sign of DVT Vaginal Bleeding: none  Assessment: s/p Procedure(s) with comments: LAPAROSCOPY OPERATIVE REMOVAL OF ECTOPIC PREGNANCY (N/A) - Attempted LAPAROTOMY (N/A) UNILATERAL SALPINGECTOMY (Right): stable  Plan: Encourage ambulation Clear liquids Will see how she feels at lunch and advance to full liquids if not nauseated.  LOS: 4 days    LEGGETT,KELLY H. 12/30/2012, 9:51 AM

## 2013-01-03 NOTE — Op Note (Signed)
OPERATIVE NOTE:   Preoperative diagnosis: Ectopic pregnancy of 13 weeks, rule out secondary abdominal pregnancy, large uterine myomas Postoperative diagnosis: Right tubal ampullary ectopic pregnancy at 13 weeks, unruptured, large uterine myomas, left hydrosalpinx. Procedure: Laparoscopy, laparotomy, right salpingectomy Surgeon:Karlos Scadden Assistant: Elsie Lincoln M.D. Anesthesia: Gen. Endotracheal Complications: None Specimens: Right tube with ectopic to pathology Estimated blood loss: 100 mL  Findings: Uterus was enlarged with myomas and rose to 16 weeks size on abdominal exam. A dominant myoma was 10 cm. On laparoscopy, there were perihepatic adhesions. The gallbladder was normal. The appendix was not visualized. The left tube showed hydrosalpinx and was distended to 1.5 cm. There were filmy periovarian adhesions. On laparoscopy the posterior large myoma was completely occluding access to the right adnexa. After some manipulation the right adnexa could be visualized. The tube was discolored and massively distended with ectopic pregnancy. The distal end also showed pre-existing distal tubal disease. The right ovary was covered with old blood clots.  This the procedure: The patient was given general endotracheal anesthesia in dorsal supine position. She was prepped and draped in lithotomy position. A Foley catheter was inserted into the bladder. A ZUMI manipulator was inserted into the uterus. The surgeon was regloved regloved and an operative field was created on the abdomen. Due to the enlargement of the uterus, we chose a high supraumbilical spot to make the 5 mm incision after preemptive anesthesia with quarter percent bupivacaine and 1 200,000 epinephrine. A Verress needle was inserted and its correct location was verified. A pneumoperitoneum was created. A trocar was inserted and video laparoscopy was started at 30 5 mm laparoscope. A right paraumbilical 5 mm incision and a left lower  quadrant 5 mm incision were also made and corresponding trochars were placed under direct visualization. A stay suture was placed on the fundal myoma and brought out through the skin with Endo Close device in order to bring the uterus on traction. It was still difficult to gain adequate access and manipulate the right adnexa. Therefore we decided to do a laparotomy.  A Pfannenstiel skin incision was made and peritoneal cavity was entered after careful dissection all all the anatomic layers. Using the fundal stay suture the uterus was exteriorized, gaining access the right adnexa. After blunt and sharp dissection and irrigating the old blood clots surrounding the adnexa, a right salpingectomy was performed using successive applications of the LigaSure device. Care was taken to stay away from the ovarian hilum and the infundibulopelvic ligament.  The pelvis was copiously irrigated and the bleeding area on the uterine fundus was hemostased. We decided not to perform a left salpingectomy although the patient will clearly need in vitro fertilization in order to conceive in the future. We will discuss this before she is scheduled for a myomectomy with her. The instrument count and lap pad count were correct.  The rectus sheath was approximated with 0 Vicryl continuous suture. The skin was approximated with staples. Estimated blood loss was 100 mL. The patient tolerated the procedure well and was transferred to recovery in satisfactory condition.   Fermin Schwab

## 2013-01-08 ENCOUNTER — Other Ambulatory Visit (HOSPITAL_COMMUNITY): Payer: Self-pay | Admitting: Obstetrics and Gynecology

## 2013-01-08 DIAGNOSIS — Z3141 Encounter for fertility testing: Secondary | ICD-10-CM

## 2013-01-28 ENCOUNTER — Ambulatory Visit (HOSPITAL_COMMUNITY): Admission: RE | Admit: 2013-01-28 | Payer: Self-pay | Source: Ambulatory Visit

## 2013-12-23 ENCOUNTER — Encounter (HOSPITAL_COMMUNITY): Payer: Self-pay | Admitting: Radiology

## 2014-03-06 ENCOUNTER — Encounter (HOSPITAL_BASED_OUTPATIENT_CLINIC_OR_DEPARTMENT_OTHER): Payer: Self-pay | Admitting: Obstetrics and Gynecology

## 2014-07-31 ENCOUNTER — Encounter (HOSPITAL_BASED_OUTPATIENT_CLINIC_OR_DEPARTMENT_OTHER): Payer: Self-pay | Admitting: Obstetrics and Gynecology

## 2015-05-02 ENCOUNTER — Encounter (HOSPITAL_COMMUNITY): Payer: Self-pay

## 2015-05-02 DIAGNOSIS — R220 Localized swelling, mass and lump, head: Secondary | ICD-10-CM | POA: Insufficient documentation

## 2015-05-02 NOTE — ED Notes (Signed)
Pt reports 1 hour PTA right cheek swelling.  Pt took Ibuprofen 400 mg.  No toothache, ear pain, sinus pain/pressure, cold/cough symptoms, difficulty swallowing.

## 2015-05-03 ENCOUNTER — Emergency Department (HOSPITAL_COMMUNITY)
Admission: EM | Admit: 2015-05-03 | Discharge: 2015-05-03 | Disposition: A | Discharge status: Home / Self Care | Payer: BLUE CROSS/BLUE SHIELD | Attending: Emergency Medicine | Admitting: Emergency Medicine

## 2015-05-03 ENCOUNTER — Encounter (HOSPITAL_COMMUNITY): Payer: Self-pay | Admitting: Emergency Medicine

## 2015-05-03 ENCOUNTER — Emergency Department (HOSPITAL_COMMUNITY): Payer: BLUE CROSS/BLUE SHIELD

## 2015-05-03 DIAGNOSIS — K047 Periapical abscess without sinus: Secondary | ICD-10-CM | POA: Insufficient documentation

## 2015-05-03 DIAGNOSIS — Z8669 Personal history of other diseases of the nervous system and sense organs: Secondary | ICD-10-CM | POA: Insufficient documentation

## 2015-05-03 DIAGNOSIS — K029 Dental caries, unspecified: Secondary | ICD-10-CM | POA: Insufficient documentation

## 2015-05-03 DIAGNOSIS — R220 Localized swelling, mass and lump, head: Secondary | ICD-10-CM | POA: Diagnosis present

## 2015-05-03 HISTORY — DX: Unspecified ectopic pregnancy without intrauterine pregnancy: O00.90

## 2015-05-03 LAB — I-STAT CREATININE, ED: CREATININE: 0.7 mg/dL (ref 0.44–1.00)

## 2015-05-03 MED ORDER — IOHEXOL 300 MG/ML  SOLN
75.0000 mL | Freq: Once | INTRAMUSCULAR | Status: AC | PRN
  Administered 2015-05-03: 75 mL via INTRAVENOUS

## 2015-05-03 MED ORDER — NAPROXEN 500 MG PO TABS: 500.0000 mg | ORAL_TABLET | Freq: Two times a day (BID) | ORAL | Status: DC

## 2015-05-03 MED ORDER — PENICILLIN V POTASSIUM 500 MG PO TABS
500.0000 mg | ORAL_TABLET | Freq: Once | ORAL | Status: AC
  Administered 2015-05-03: 500 mg via ORAL
  Filled 2015-05-03: qty 1

## 2015-05-03 MED ORDER — PENICILLIN V POTASSIUM 500 MG PO TABS: 500.0000 mg | ORAL_TABLET | Freq: Four times a day (QID) | ORAL | Status: AC

## 2015-05-03 NOTE — Discharge Instructions (Signed)
Dental Abscess A dental abscess is a collection of pus in or around a tooth. CAUSES This condition is caused by a bacterial infection around the root of the tooth that involves the inner part of the tooth (pulp). It may result from:  Severe tooth decay.  Trauma to the tooth that allows bacteria to enter into the pulp, such as a broken or chipped tooth.  Severe gum disease around a tooth. SYMPTOMS Symptoms of this condition include:  Severe pain in and around the infected tooth.  Swelling and redness around the infected tooth, in the mouth, or in the face.  Tenderness.  Pus drainage.  Bad breath.  Bitter taste in the mouth.  Difficulty swallowing.  Difficulty opening the mouth.  Nausea.  Vomiting.  Chills.  Swollen neck glands.  Fever. DIAGNOSIS This condition is diagnosed with examination of the infected tooth. During the exam, your dentist may tap on the infected tooth. Your dentist will also ask about your medical and dental history and may order X-rays. TREATMENT This condition is treated by eliminating the infection. This may be done with:  Antibiotic medicine.  A root canal. This may be performed to save the tooth.  Pulling (extracting) the tooth. This may also involve draining the abscess. This is done if the tooth cannot be saved. HOME CARE INSTRUCTIONS  Take medicines only as directed by your dentist.  If you were prescribed antibiotic medicine, finish all of it even if you start to feel better.  Rinse your mouth (gargle) often with salt water to relieve pain or swelling.  Do not drive or operate heavy machinery while taking pain medicine.  Do not apply heat to the outside of your mouth.  Keep all follow-up visits as directed by your dentist. This is important. SEEK MEDICAL CARE IF:  Your pain is worse and is not helped by medicine. SEEK IMMEDIATE MEDICAL CARE IF:  You have a fever or chills.  Your symptoms suddenly get worse.  You have a  very bad headache.  You have problems breathing or swallowing.  You have trouble opening your mouth.  You have swelling in your neck or around your eye.   This information is not intended to replace advice given to you by your health care provider. Make sure you discuss any questions you have with your health care provider.   Document Released: 02/07/2005 Document Revised: 06/24/2014 Document Reviewed: 02/04/2014 Elsevier Interactive Patient Education 2016 [address] is a great source of information about community services available.  Access by dialing 2-1-1 from anywhere in [place], or by website -  CustodianSupply.fi.   Other Local Resources (Updated 02/2015)  Dental  Care   Services    Phone Number and Address  Cost  [facility] For children 21 - 50 years of age:   Cleaning  Tooth brushing/flossing instruction  Sealants, fillings, crowns  Extractions  Emergency treatment  [phone] [address],  [address] Charges based on family income.  Medicaid and some insurance plans accepted.     [facility] - [facility], fillings, crowns  Extractions  Emergency treatment [phone] [address], [place]  Pregnant women 31 years of age or older with a Medicaid card  [facility] - [place]  Cleaning  Sealants, fillings, crowns  Extractions  Emergency treatment [phone] [place], [place]  Pregnant women 63 years of age or older with a Medicaid card  [facility] For children 60 - 34 years of age:   Cleaning  Tooth brushing/flossing instruction  Sealants, fillings, crowns  Extractions  Emergency treatment Limited orthodontic services for patients with Medicaid [phone] [place], [place] [address] Medicaid and [facility] Health Choice cover for children up to age 39 and pregnant women.  Parents of children up to age 68 without Medicaid pay a reduced fee at time of service.  [facility] For children 82 - 40 years of age:   Cleaning  Tooth brushing/flossing instruction  Sealants, fillings, crowns  Extractions  Emergency treatment Limited orthodontic services for patients with Medicaid [phone] [place], [place].  Medicaid and [facility] Health Choice cover for children up to age 72 and pregnant women.  Parents of children up to age 45 without Medicaid pay a reduced fee.  [facility]  Sealants, fillings, crowns  Extractions  Hours: Tuesdays and Thursdays, 4:15 - 8 pm [phone] [address] [place], [place] [address] Services free of charge to [facility] residents ages 18-64 who do not have health insurance, Medicare, [place], or [place] benefits and fall within federal poverty guidelines  Real care in addition to primary medical care, nutritional counseling, and pharmacy:  Engineer, drilling, fillings, crowns  Extractions                  [phone] [facility], [place], [place], [place], [place], [place] [facility], [place], [place] [facility] [place], [place], [place], most insurance.  Also provides services available to all with fees adjusted based on ability to pay.    [facility]  Cleaning  Tooth brushing/flossing  instruction  Sealants, fillings, crowns  Extractions  Emergency treatment Hours: Tuesdays, Thursdays, and Fridays from 8 am to 5 pm by appointment only. [phone] [address] [place], [place] [address] [facility] residents with Medicaid (depending on eligibility) and children with [facility] Health Choice - call for more information.  [facility]  Extractions only  Hours: 2nd and 4th Thursday of each month from 6:30 am - 9 am.   [phone] ext. [address] [place], [place] [address] Ages 68 and older only.  Patients are seen on a first come, first served basis.  [employer]  [employer]  Extractions  Orthodontics  Endodontics  Implants/Crowns/Bridges  Complete and partial dentures [phone] [place],  Patients must complete an application for services.  There is often a waiting list.

## 2015-05-03 NOTE — ED Provider Notes (Signed)
CSN: [id number]     Arrival date & time 05/03/15  0029 History   First MD Initiated Contact with Patient 05/03/15 0441     Chief Complaint  Patient presents with  . Facial Swelling     (Consider location/radiation/quality/duration/timing/severity/associated sxs/prior Treatment) HPI Comments: 30 year old female with no significant past medical history presents to the emergency department for evaluation of swelling to her right cheek which began 2 hours prior to arrival. Patient states that she has noticed the swelling worsening, though she denies any pain. She took some ibuprofen for her symptoms without relief. She has been able to eat and drink without difficulty. Patient denies fever, head injury or trauma, dental pain, purulent drainage in her mouth, sore throat, nausea, vomiting, drooling, or inability to swallow. No hx of similar symptoms.  The history is provided by the patient. No language interpreter was used.    Past Medical History  Diagnosis Date  . Glaucoma     follows up with opthalmologist once a year for pressure check  . Ectopic pregnancy    Past Surgical History  Procedure Laterality Date  . Refractive surgery    . Operative ultrasound Right 12/14/2012    Procedure: OPERATIVE ULTRASOUND GUIDED INJECTION OF POTASSIUM CHLORIDE 2.5ML;  Surgeon: Governor Specking, MD;  Location: [facility];  Service: Gynecology;  Laterality: Right;  . Laparoscopy N/A 12/26/2012    Procedure: LAPAROSCOPY OPERATIVE REMOVAL OF ECTOPIC PREGNANCY;  Surgeon: Governor Specking, MD;  Location: [facility];  Service: Gynecology;  Laterality: N/A;  Attempted  . Laparotomy N/A 12/26/2012    Procedure: LAPAROTOMY;  Surgeon: Governor Specking, MD;  Location: [facility];  Service: Gynecology;  Laterality: N/A;  . Unilateral salpingectomy Right 12/26/2012    Procedure: UNILATERAL SALPINGECTOMY;  Surgeon: Governor Specking, MD;  Location: [facility];  Service: Gynecology;  Laterality: Right;   No family  history on file. Social History  Substance Use Topics  . Smoking status: Never Smoker   . Smokeless tobacco: None  . Alcohol Use: Yes     Comment: socially   OB History    Gravida Para Term Preterm AB TAB SAB Ectopic Multiple Living   1 0 0 0 1 0 0 1 0 0       Review of Systems  Constitutional: Negative for fever.  HENT: Positive for facial swelling. Negative for dental problem.   Gastrointestinal: Negative for nausea and vomiting.  All other systems reviewed and are negative.   Allergies  Review of patient's allergies indicates no known allergies.  Home Medications   Prior to Admission medications   Medication Sig Start Date End Date Taking? Authorizing Provider  amoxicillin-clavulanate (AUGMENTIN) 400-57 MG/5ML suspension Take 10 mLs (800 mg total) by mouth 2 (two) times daily. Patient not taking: Reported on 05/03/2015 12/30/12   Guss Bunde, MD  ferrous sulfate (FERROUSUL) 325 (65 FE) MG tablet Take 1 tablet (325 mg total) by mouth 2 (two) times daily. Patient not taking: Reported on 05/03/2015 12/15/12   Woodroe Mode, MD  naproxen (NAPROSYN) 500 MG tablet Take 1 tablet (500 mg total) by mouth 2 (two) times daily. 05/03/15   Antonietta Breach, PA-C  ondansetron (ZOFRAN) 4 MG tablet Take 1 tablet (4 mg total) by mouth every 6 (six) hours as needed for nausea. Patient not taking: Reported on 05/03/2015 12/15/12   Ryann Cowart, MD  oxyCODONE-acetaminophen (ROXICET) 5-325 MG per tablet Take 1-2 tablets by mouth every 4 (four) hours as needed for severe pain. Patient not taking: Reported  on 05/03/2015 12/30/12   Guss Bunde, MD  penicillin v potassium (VEETID) 500 MG tablet Take 1 tablet (500 mg total) by mouth 4 (four) times daily. 05/03/15 05/10/15  Antonietta Breach, PA-C   BP 142/103 mmHg  Pulse 63  Temp(Src) 98.4 F (36.9 C) (Oral)  Resp 19  SpO2 99%   Physical Exam  Constitutional: She is oriented to person, place, and time. She appears well-developed and well-nourished. No  distress.  Nontoxic/nonseptic appearing  HENT:  Head: Normocephalic and atraumatic.    Right Ear: External ear normal.  Left Ear: External ear normal.  Mouth/Throat: Uvula is midline, oropharynx is clear and moist and mucous membranes are normal. No trismus in the jaw. Dental caries present.  No trismus. No dental TTP. Dental caries noted. No gingival swelling.  Eyes: Conjunctivae and EOM are normal. No scleral icterus.  Neck: Normal range of motion.  No nuchal rigidity or meningismus  Pulmonary/Chest: Effort normal. No respiratory distress.  Respirations even and unlabored  Musculoskeletal: Normal range of motion.  Neurological: She is alert and oriented to person, place, and time. She exhibits normal muscle tone. Coordination normal.  Skin: Skin is warm and dry. No rash noted. She is not diaphoretic. No erythema. No pallor.  Psychiatric: She has a normal mood and affect. Her behavior is normal.  Nursing note and vitals reviewed.   ED Course  Procedures (including critical care time) Labs Review Labs Reviewed  I-STAT CREATININE, ED    Imaging Review Ct Soft Tissue Neck W Contrast  05/03/2015  CLINICAL DATA:  RIGHT cheek swelling for 1 hour prior to arrival. EXAM: CT NECK WITH CONTRAST TECHNIQUE: Multidetector CT imaging of the neck was performed using the standard protocol following the bolus administration of intravenous contrast. CONTRAST:  41mL OMNIPAQUE IOHEXOL 300 MG/ML  SOLN COMPARISON:  None. FINDINGS: Pharynx and larynx: Normal. Salivary glands: Normal. Thyroid: Normal. Lymph nodes: No lymphadenopathy by CT size criteria. Vascular: Normal. Limited intracranial: Normal. Visualized orbits: Normal. Mastoids and visualized paranasal sinuses: Normal. Skeleton/soft tissue: RIGHT mid and lower facial soft tissue swelling, reticulated subcutaneous fat with 4 mm RIGHT buccal abscess associated with periapical lucency/ abscess third tooth. Multiple dental caries. Upper chest: Lung  apices are clear. No superior mediastinal lymphadenopathy. IMPRESSION: RIGHT facial cellulitis with superimposed 4 mm buccal abscess contiguous with third tooth periapical lucency/abscess. Generalized poor dentition. Electronically Signed   By: Elon Alas M.D.   On: 05/03/2015 06:29     I have personally reviewed and evaluated these images and lab results as part of my medical decision-making.   EKG Interpretation None      MDM   Final diagnoses:  Dental abscess    Patient presenting for facial swelling onset this evening. No dental pain; however CT reveals dental abscess with associated facial cellulitis. Patient afebrile, well appearing. Exam unconcerning for Ludwig's angina. Will treat with penicillin and NSAIDs. Urged patient to follow-up with dentist. Referral and resource guide provided. Patient discharged in satisfactory condition with no unaddressed concerns.     Antonietta Breach, PA-C 05/03/15 [place], MD 05/03/15 [phone]

## 2015-05-03 NOTE — ED Notes (Signed)
Nurse from [facility] called and states pt is in their ER waiting to be seen. Pt is moved to [facility]TF so she can be registered at [facility].

## 2015-05-03 NOTE — ED Notes (Signed)
Pt presents with complaints of cheek swelling. Pt states that around 2100 she began having swelling in her right cheek. She took 400mg  of mortin. Pt states her cheek is tingling, but reports no pain. Pt has no airway obstruction and is stable at this time.

## 2015-09-15 ENCOUNTER — Emergency Department (HOSPITAL_COMMUNITY)
Admission: EM | Admit: 2015-09-15 | Discharge: 2015-09-15 | Disposition: A | Discharge status: Home / Self Care | Payer: BLUE CROSS/BLUE SHIELD | Attending: Emergency Medicine | Admitting: Emergency Medicine

## 2015-09-15 ENCOUNTER — Encounter (HOSPITAL_COMMUNITY): Payer: Self-pay

## 2015-09-15 DIAGNOSIS — Z79899 Other long term (current) drug therapy: Secondary | ICD-10-CM | POA: Insufficient documentation

## 2015-09-15 DIAGNOSIS — M799 Soft tissue disorder, unspecified: Secondary | ICD-10-CM | POA: Diagnosis present

## 2015-09-15 DIAGNOSIS — Z792 Long term (current) use of antibiotics: Secondary | ICD-10-CM | POA: Diagnosis not present

## 2015-09-15 DIAGNOSIS — L720 Epidermal cyst: Secondary | ICD-10-CM

## 2015-09-15 NOTE — ED Notes (Signed)
Pt presents with a bump on the side of her nose which appeared today. No pain and no exudate noted. No reddness or warmth at the site.

## 2015-09-15 NOTE — ED Provider Notes (Signed)
[facility] DEPT Provider Note   CSN: [id number] Arrival date & time: 09/15/15  2048  First Provider Contact:  None       History   Chief Complaint Chief Complaint  Patient presents with  . Other    LUMP ON THE LEFT SIDE OF THE NOSE X1 MONTH     HPI Dominique Jones is a 30 y.o. female.  Patient is a 30 year old female who presents the ED with complaint of swelling to the side of her nose, onset one week. Patient reports having a small lesion to the left side of her nose which she states has increased in size over the past few days. Patient reports when she pushes against the mass she feels that she is unable to breathe out of the left side of her nose as well. Patient endorses intermittent nasal congestion. Denies epistaxis or nasal deformity. Patient denies any nasal trauma. Denies fever, headache, visual changes, lightheadedness, dizziness, sore throat, cough, ear pain. Patient denies taking any medications prior to arrival. Denies redness, swelling, drainage.      Past Medical History:  Diagnosis Date  . Ectopic pregnancy   . Glaucoma    follows up with opthalmologist once a year for pressure check    Patient Active Problem List   Diagnosis Date Noted  . Fibroids 12/30/2012  . Anemia 12/30/2012  . Laceration of palate 12/30/2012    Past Surgical History:  Procedure Laterality Date  . LAPAROSCOPY N/A 12/26/2012   Procedure: LAPAROSCOPY OPERATIVE REMOVAL OF ECTOPIC PREGNANCY;  Surgeon: Governor Specking, MD;  Location: [facility];  Service: Gynecology;  Laterality: N/A;  Attempted  . LAPAROTOMY N/A 12/26/2012   Procedure: LAPAROTOMY;  Surgeon: Governor Specking, MD;  Location: [facility];  Service: Gynecology;  Laterality: N/A;  . OPERATIVE ULTRASOUND Right 12/14/2012   Procedure: OPERATIVE ULTRASOUND GUIDED INJECTION OF POTASSIUM CHLORIDE 2.5ML;  Surgeon: Governor Specking, MD;  Location: [facility];  Service: Gynecology;  Laterality: Right;  . REFRACTIVE  SURGERY    . UNILATERAL SALPINGECTOMY Right 12/26/2012   Procedure: UNILATERAL SALPINGECTOMY;  Surgeon: Governor Specking, MD;  Location: [facility];  Service: Gynecology;  Laterality: Right;    OB History    Gravida Para Term Preterm AB Living   1 0 0 0 1 0   SAB TAB Ectopic Multiple Live Births   0 0 1 0         Home Medications    Prior to Admission medications   Medication Sig Start Date End Date Taking? Authorizing Provider  amoxicillin-clavulanate (AUGMENTIN) 400-57 MG/5ML suspension Take 10 mLs (800 mg total) by mouth 2 (two) times daily. Patient not taking: Reported on 05/03/2015 12/30/12   Guss Bunde, MD  ferrous sulfate (FERROUSUL) 325 (65 FE) MG tablet Take 1 tablet (325 mg total) by mouth 2 (two) times daily. Patient not taking: Reported on 05/03/2015 12/15/12   Woodroe Mode, MD  naproxen (NAPROSYN) 500 MG tablet Take 1 tablet (500 mg total) by mouth 2 (two) times daily. 05/03/15   Antonietta Breach, PA-C  ondansetron (ZOFRAN) 4 MG tablet Take 1 tablet (4 mg total) by mouth every 6 (six) hours as needed for nausea. Patient not taking: Reported on 05/03/2015 12/15/12   Ryann Cowart, MD  oxyCODONE-acetaminophen (ROXICET) 5-325 MG per tablet Take 1-2 tablets by mouth every 4 (four) hours as needed for severe pain. Patient not taking: Reported on 05/03/2015 12/30/12   Guss Bunde, MD    Family History History reviewed. No pertinent  family history.  Social History Social History  Substance Use Topics  . Smoking status: Never Smoker  . Smokeless tobacco: Never Used  . Alcohol use Yes     Comment: socially     Allergies   Review of patient's allergies indicates no known allergies.   Review of Systems Review of Systems  Skin:       Skin mass  All other systems reviewed and are negative.    Physical Exam Updated Vital Signs BP 139/93 (BP Location: Left Arm)   Pulse 81   Temp 99.5 F (37.5 C) (Oral)   Resp 15   Ht 5\' 6"  (1.676 m)   Wt 86.2 kg   LMP 08/25/2015  (Approximate)   SpO2 99%   Breastfeeding? No   BMI 30.67 kg/m   Physical Exam  Constitutional: She is oriented to person, place, and time. She appears well-developed and well-nourished. No distress.  HENT:  Head: Normocephalic and atraumatic.  Right Ear: Tympanic membrane normal.  Left Ear: Tympanic membrane normal.  Nose: Nose normal. No mucosal edema, rhinorrhea, nose lacerations, sinus tenderness, nasal deformity, septal deviation or nasal septal hematoma. No epistaxis.  No foreign bodies. Right sinus exhibits no maxillary sinus tenderness and no frontal sinus tenderness. Left sinus exhibits no maxillary sinus tenderness and no frontal sinus tenderness.    Mouth/Throat: Uvula is midline, oropharynx is clear and moist and mucous membranes are normal. No oropharyngeal exudate, posterior oropharyngeal edema, posterior oropharyngeal erythema or tonsillar abscesses.  1x1cm mobile, nontender superficial cyst noted to lateral medial aspect of left maxillary region just lateral to left side of nose. No surrounding swelling, erythema, warmth or drainage.  Eyes: Conjunctivae and EOM are normal. Right eye exhibits no discharge. Left eye exhibits no discharge. No scleral icterus.  Neck: Normal range of motion. Neck supple.  Cardiovascular: Normal rate, regular rhythm, normal heart sounds and intact distal pulses.   Pulmonary/Chest: Effort normal and breath sounds normal.  Abdominal: Soft. She exhibits no distension. There is no tenderness.  Musculoskeletal: Normal range of motion. She exhibits no edema.  Lymphadenopathy:    She has no cervical adenopathy.  Neurological: She is alert and oriented to person, place, and time. She has normal strength. No cranial nerve deficit or sensory deficit. Coordination normal.  Skin: Skin is warm and dry. She is not diaphoretic.  Nursing note and vitals reviewed.    ED Treatments / Results  Labs (all labs ordered are listed, but only abnormal results are  displayed) Labs Reviewed - No data to display  EKG  EKG Interpretation None       Radiology No results found.  Procedures Procedures (including critical care time)  Medications Ordered in ED Medications - No data to display   Initial Impression / Assessment and Plan / ED Course  I have reviewed the triage vital signs and the nursing notes.  Pertinent labs & imaging results that were available during my care of the patient were reviewed by me and considered in my medical decision making (see chart for details).  Clinical Course   Patient presents with mass to her left cheek/nose which has increased in size over the past few days. Denies fever, headache, redness or drainage. VSS. Exam revealed small mobile cystic lesion to medial aspect of left maxillary region, consistent with epidermal cyst. No signs of abscess or cellulitis. No neuro deficits. Remaining HEENT exam unremarkable. Plan to discharge patient home with dermatology follow-up for further management and evaluation, discussed return precautions with patient.  Final Clinical Impressions(s) / ED Diagnoses   Final diagnoses:  Epidermoid cyst of skin    New Prescriptions Discharge Medication List as of 09/15/2015 11:43 PM       Nona Dell, PA-C [id number]    Delora Fuel, MD [id number]

## 2015-09-15 NOTE — ED Triage Notes (Signed)
PT HERE FOR A SUSPICIOUS "LUMP" ON THE LEFT SIDE OF HER NOSE X1 MONTH. PT STATES IT LOOKED LIKE A SMALL BUMP, THEN SHE NOTICED IT GOT BIGGER. DENIES IT BEING A BUG BITE, BUT STATES IF SHE PRESSES ON IT, IT BLOCKS THE AIRFLOW IN THE LEFT NOSTRIL.

## 2015-09-15 NOTE — ED Notes (Signed)
Bed: WA28 Expected date:  Expected time:  Means of arrival:  Comments: 

## 2015-09-15 NOTE — ED Notes (Signed)
Pt ambulatory and independent at discharge.  Verbalized understanding of discharge instructions 

## 2015-09-15 NOTE — Discharge Instructions (Signed)
I recommend following up with the dermatology clinic listed above for further management. Please return to the Emergency Department if symptoms worsen or new onset of fever, nasal discharge/bleeding, nasal obstruction, facial swelling, redness, drainage.

## 2015-09-15 NOTE — ED Notes (Signed)
Report to oncoming shift.

## 2015-09-15 NOTE — ED Notes (Signed)
PA at bedside.

## 2015-10-27 ENCOUNTER — Other Ambulatory Visit (HOSPITAL_COMMUNITY): Payer: Self-pay | Admitting: Obstetrics and Gynecology

## 2015-10-27 DIAGNOSIS — N979 Female infertility, unspecified: Secondary | ICD-10-CM

## 2015-10-28 ENCOUNTER — Other Ambulatory Visit: Payer: Self-pay | Admitting: Obstetrics and Gynecology

## 2015-10-30 ENCOUNTER — Encounter (HOSPITAL_COMMUNITY): Payer: Self-pay | Admitting: Anesthesiology

## 2015-10-30 ENCOUNTER — Ambulatory Visit (HOSPITAL_COMMUNITY)
Admission: RE | Admit: 2015-10-30 | Discharge: 2015-10-30 | Disposition: A | Discharge status: Home / Self Care | Payer: BLUE CROSS/BLUE SHIELD | Source: Ambulatory Visit | Attending: Obstetrics and Gynecology | Admitting: Obstetrics and Gynecology

## 2015-10-30 DIAGNOSIS — D259 Leiomyoma of uterus, unspecified: Secondary | ICD-10-CM | POA: Diagnosis not present

## 2015-10-30 DIAGNOSIS — N7011 Chronic salpingitis: Secondary | ICD-10-CM | POA: Insufficient documentation

## 2015-10-30 DIAGNOSIS — N971 Female infertility of tubal origin: Secondary | ICD-10-CM | POA: Diagnosis present

## 2015-10-30 DIAGNOSIS — N979 Female infertility, unspecified: Secondary | ICD-10-CM

## 2015-10-30 MED ORDER — IOPAMIDOL (ISOVUE-300) INJECTION 61%
30.0000 mL | Freq: Once | INTRAVENOUS | Status: AC | PRN
  Administered 2015-10-30: 15 mL

## 2015-10-30 NOTE — Patient Instructions (Signed)
Your procedure is scheduled on:  Tuesday, Sept. 19, 2017  Enter through the [employer] [facility] at:  6:00 AM  Pick up the phone at the desk and dial [phone].  Call this number if you have problems the morning of surgery: [phone].  Remember: Do NOT eat food or drink after:  Midnight Monday, Sept. 18, 2017  Take these medicines the morning of surgery with a SIP OF WATER:  None  Do NOT wear jewelry (body piercing), metal hair clips/bobby pins, make-up, or nail polish. Do NOT wear lotions, powders, or perfumes.  You may wear deodorant. Do NOT shave for 48 hours prior to surgery. Do NOT bring valuables to the hospital. Contacts, dentures, or bridgework may not be worn into surgery.  Leave suitcase in car.  After surgery it may be brought to your room.  For patients admitted to the hospital, checkout time is 11:00 AM the day of discharge.

## 2015-11-02 ENCOUNTER — Encounter (HOSPITAL_COMMUNITY): Payer: Self-pay

## 2015-11-02 ENCOUNTER — Encounter (HOSPITAL_COMMUNITY)
Admission: RE | Admit: 2015-11-02 | Discharge: 2015-11-02 | Disposition: A | Discharge status: Home / Self Care | Payer: BLUE CROSS/BLUE SHIELD | Source: Ambulatory Visit | Attending: Obstetrics and Gynecology | Admitting: Obstetrics and Gynecology

## 2015-11-02 DIAGNOSIS — Z01818 Encounter for other preprocedural examination: Secondary | ICD-10-CM | POA: Insufficient documentation

## 2015-11-02 HISTORY — DX: Adverse effect of unspecified anesthetic, initial encounter: T41.45XA

## 2015-11-02 HISTORY — DX: Other complications of anesthesia, initial encounter: T88.59XA

## 2015-11-02 LAB — CBC
HCT: 36.9 % (ref 36.0–46.0)
HEMOGLOBIN: 12.3 g/dL (ref 12.0–15.0)
MCH: 27.6 pg (ref 26.0–34.0)
MCHC: 33.3 g/dL (ref 30.0–36.0)
MCV: 82.7 fL (ref 78.0–100.0)
Platelets: 281 10*3/uL (ref 150–400)
RBC: 4.46 MIL/uL (ref 3.87–5.11)
RDW: 15.1 % (ref 11.5–15.5)
WBC: 5.1 10*3/uL (ref 4.0–10.5)

## 2015-11-02 LAB — BASIC METABOLIC PANEL
ANION GAP: 6 (ref 5–15)
BUN: 15 mg/dL (ref 6–20)
CO2: 25 mmol/L (ref 22–32)
Calcium: 8.5 mg/dL — ABNORMAL LOW (ref 8.9–10.3)
Chloride: 105 mmol/L (ref 101–111)
Creatinine, Ser: 0.65 mg/dL (ref 0.44–1.00)
GFR calc Af Amer: >60 mL/min (ref >60)
GLUCOSE: 104 mg/dL — AB (ref 65–99)
POTASSIUM: 3.8 mmol/L (ref 3.5–5.1)
Sodium: 136 mmol/L (ref 135–145)

## 2015-11-02 LAB — TYPE AND SCREEN
ABO/RH(D): A POS
ANTIBODY SCREEN: NEGATIVE

## 2015-11-02 NOTE — Pre-Procedure Instructions (Signed)
Dr. P. Carignan met with Dominique Jones during her PAT appointment to discuss concerns about her history of difficult intubation during her previous procedure. 

## 2015-11-02 NOTE — H&P (Signed)
Dominique Jones is a 30 y.o.  P: 0-0-1-0  who presents for an abdominal myomectomy because of a symptomatic uterine fibroid.  During surgery for an ectopic pregnancy in 2014 the patient was found to have a uterine fibroid and left hydrosalpinx.   Since that time her menstrual periods have  slowly become  heavier, lasting for 5 days and requiring a pad change hourly.  She will go on to have 3 days of spotting after her flow and cramping that she rates 8/10 on a 10 point pain scale.  Her cramps are managed with Tylenol and physical acitivity, both of which make the pain tolerable. She denies any missed periods or intermenstrual bleeding but admits to constipation, pelvic pressure, positional dyspareunia and random pelvic pains.   A pelvic ultrasound in May 2017 showed an anteverted uterus: 6.72 x 4.67 x 6.80 cm  and an endometrium with normal morphology by 3D rendering;   there were  no endometrial masses with saline infusion but  a 7.76 x 7.12 x 1.17 cm posterior left sub-serosal fibroid was visualized;   right ovary: 2.98 cm and left ovary: 2.27 cm; length of uterus from fundus to external os was 10.3 cm.   Patient's gonorrhea and chlamydia cultures were negative during this evaluation and in November 2016 her TSH was normal.   A review of both medical and surgical management options were given to the patient for her symptoms however,  she would like to proceed with surgical management in the form of myomectomy.   In addition, the patient understands that her only remaining tube on the left is obstructed, giving her a high risk of a second ectopic pregnancy.  We have discussed the recommendation made to her in 2014 by the reproductive endocrinologist who did her surgery that she have that tube removed at the time of her myomectomy to minimize the risk of repeat ectopic.  After careful consideration, she has decided to proceed with left salpingectomy.  She verbalizes understanding that she will only be able to  conceive with in vitro fertilization.     Past Medical History  OB History: G: 1;   P: 0-0-1-0;  Ectopic 2014  GYN History: menarche: 30 YO    LMP: 10/19/15    Contracepton no method  The patient denies history of sexually transmitted disease.  Denies history of abnormal PAP smear.   Last PAP smear: 2016-normal  Medical History: Glaucoma, Anemia and Uterine Fibroid  Surgical History: 2014 Right Salpingectomy for Ectopic Pregnancy (Laparotomy); 2000 Bilateral Eye Surgery for Glaucoma and 1993 Left Ring Finger Surgery  Denies a  history of blood transfusions but admits to problems with airway placement for anesthesia  Family History: Hypertension and Diabetes Mellitus  Social History:  Single and employed at [employer];  Denies tobacco or alcohol use   No Known Allergies   Denies sensitivity to peanuts, shellfish, soy, latex or adhesives.  Medications:  Prenatal Vitamins   po daily Naproxen 500 mg  bid pc prn  ROS: Admits to glasses, pelvic pressure  and occasional constipation;  Denies headache, vision changes, nasal congestion, dysphagia, tinnitus, dizziness, hoarseness, cough,  chest pain, shortness of breath, nausea, vomiting, diarrhea, urinary frequency, urgency  dysuria, hematuria, vaginitis symptoms,  swelling of joints,easy bruising,  myalgias, arthralgias, skin rashes, unexplained weight loss and except as is mentioned in the history of present illness, patient's review of systems is otherwise negative.   Physical Exam    Bp: 116/72  P: 80 bpm    R: 20   Weight:  192 lbs.  Height: 5' 6.5"   BMI: 30.5  Neck: supple without masses or thyromegaly Lungs: clear to auscultation Heart: regular rate and rhythm Abdomen: non-tender, firm mass from pelvis to 3 fingers above symphysis pubis  and no organomegaly Pelvic:EGBUS- wnl; vagina-normal rugae; uterus-irregular, enlarged to 14 weeks  size, cervix without lesions or motion tenderness; adnexae-no tenderness or  masses Extremities:  no clubbing, cyanosis or edema   Assesment:  1)Symptomatic Uterine Fibroid 2) s/p right salpingectomy for ectopic 3)left hydrosalpinx with tubal obstruction   Disposition:  A discussion was held with patient regarding the indication for her procedure(s) along with the risks, which include but are not limited to: reaction to anesthesia, damage to adjacent organs, infection, excessive bleeding, and the need for a C-section delivery should the endometrial cavity become breached during removal of the fibroid (s), as well as the risk of the need for hysterectomy at the time of intended myomectomy.  Since the patient has accepted the recommendation of the reproductive endocrinologist who did her surgery in 2014 to have the left hydrosalpinx removed, she also verbalizes understanding that she will require in vitro fertilization in order to conceive  The patient verbalized understanding of these risks and has consented to proceed with an Abdominal Myomectomy and left salpingectomy at [facility] on September 19,2017 at 7:30 a.m.   CSN# [id number]   [place] J. Florene Glen, PA-C  for Dr. Seymour Bars. Valgene Deloatch

## 2015-11-09 MED ORDER — DEXTROSE 5 % IV SOLN
2.0000 g | INTRAVENOUS | Status: AC
  Administered 2015-11-10: 2 g via INTRAVENOUS
  Filled 2015-11-09: qty 2

## 2015-11-10 ENCOUNTER — Inpatient Hospital Stay (HOSPITAL_COMMUNITY): Payer: BLUE CROSS/BLUE SHIELD | Admitting: Anesthesiology

## 2015-11-10 ENCOUNTER — Encounter (HOSPITAL_COMMUNITY): Payer: Self-pay | Admitting: Anesthesiology

## 2015-11-10 ENCOUNTER — Inpatient Hospital Stay (HOSPITAL_COMMUNITY)
Admission: RE | Admit: 2015-11-10 | Discharge: 2015-11-11 | DRG: 743 | Disposition: A | Discharge status: Home / Self Care | Payer: BLUE CROSS/BLUE SHIELD | Source: Ambulatory Visit | Attending: Obstetrics and Gynecology | Admitting: Obstetrics and Gynecology

## 2015-11-10 ENCOUNTER — Encounter (HOSPITAL_COMMUNITY)
Admission: RE | Disposition: A | Discharge status: Home / Self Care | Payer: Self-pay | Source: Ambulatory Visit | Attending: Obstetrics and Gynecology

## 2015-11-10 DIAGNOSIS — D649 Anemia, unspecified: Secondary | ICD-10-CM | POA: Diagnosis present

## 2015-11-10 DIAGNOSIS — N7011 Chronic salpingitis: Secondary | ICD-10-CM | POA: Diagnosis present

## 2015-11-10 DIAGNOSIS — D252 Subserosal leiomyoma of uterus: Secondary | ICD-10-CM | POA: Diagnosis present

## 2015-11-10 DIAGNOSIS — H409 Unspecified glaucoma: Secondary | ICD-10-CM | POA: Diagnosis present

## 2015-11-10 DIAGNOSIS — N8312 Corpus luteum cyst of left ovary: Secondary | ICD-10-CM | POA: Diagnosis present

## 2015-11-10 DIAGNOSIS — R102 Pelvic and perineal pain: Secondary | ICD-10-CM | POA: Diagnosis present

## 2015-11-10 DIAGNOSIS — D219 Benign neoplasm of connective and other soft tissue, unspecified: Secondary | ICD-10-CM | POA: Diagnosis present

## 2015-11-10 HISTORY — PX: MYOMECTOMY: SHX85

## 2015-11-10 LAB — HCG, QUANTITATIVE, PREGNANCY: hCG, Beta Chain, Quant, S: <1 m[IU]/mL (ref <5)

## 2015-11-10 SURGERY — MYOMECTOMY, ABDOMINAL APPROACH
Anesthesia: General

## 2015-11-10 MED ORDER — LACTATED RINGERS IV SOLN
INTRAVENOUS | Status: DC
  Administered 2015-11-10 – 2015-11-11 (×2): 125 mL/h via INTRAVENOUS

## 2015-11-10 MED ORDER — DEXAMETHASONE SODIUM PHOSPHATE 4 MG/ML IJ SOLN
INTRAMUSCULAR | Status: AC
  Filled 2015-11-10: qty 1

## 2015-11-10 MED ORDER — LACTATED RINGERS IV SOLN: INTRAVENOUS | Status: DC

## 2015-11-10 MED ORDER — MENTHOL 3 MG MT LOZG: 1.0000 | LOZENGE | OROMUCOSAL | Status: DC | PRN

## 2015-11-10 MED ORDER — MEPERIDINE HCL 25 MG/ML IJ SOLN
INTRAMUSCULAR | Status: AC
  Filled 2015-11-10: qty 1

## 2015-11-10 MED ORDER — HEPARIN SODIUM (PORCINE) 5000 UNIT/ML IJ SOLN
INTRAMUSCULAR | Status: AC
  Filled 2015-11-10: qty 1

## 2015-11-10 MED ORDER — VASOPRESSIN 20 UNIT/ML IV SOLN
INTRAVENOUS | Status: AC
  Filled 2015-11-10: qty 2

## 2015-11-10 MED ORDER — ONDANSETRON HCL 4 MG/2ML IJ SOLN
INTRAMUSCULAR | Status: DC | PRN
  Administered 2015-11-10: 4 mg via INTRAVENOUS

## 2015-11-10 MED ORDER — LIDOCAINE HCL (CARDIAC) 20 MG/ML IV SOLN
INTRAVENOUS | Status: AC
  Filled 2015-11-10: qty 5

## 2015-11-10 MED ORDER — MIDAZOLAM HCL 2 MG/2ML IJ SOLN
INTRAMUSCULAR | Status: DC | PRN
  Administered 2015-11-10 (×2): 1 mg via INTRAVENOUS

## 2015-11-10 MED ORDER — FENTANYL CITRATE (PF) 100 MCG/2ML IJ SOLN
INTRAMUSCULAR | Status: AC
  Administered 2015-11-10: 50 ug via INTRAVENOUS
  Filled 2015-11-10: qty 2

## 2015-11-10 MED ORDER — SCOPOLAMINE 1 MG/3DAYS TD PT72
MEDICATED_PATCH | TRANSDERMAL | Status: DC
Start: 2015-11-10 — End: 2015-11-11
  Administered 2015-11-10: 1.5 mg via TRANSDERMAL
  Filled 2015-11-10: qty 1

## 2015-11-10 MED ORDER — ONDANSETRON HCL 4 MG PO TABS: 4.0000 mg | ORAL_TABLET | Freq: Four times a day (QID) | ORAL | Status: DC | PRN

## 2015-11-10 MED ORDER — DEXAMETHASONE SODIUM PHOSPHATE 10 MG/ML IJ SOLN
INTRAMUSCULAR | Status: DC | PRN
  Administered 2015-11-10: 4 mg via INTRAVENOUS

## 2015-11-10 MED ORDER — KETOROLAC TROMETHAMINE 30 MG/ML IJ SOLN
30.0000 mg | Freq: Four times a day (QID) | INTRAMUSCULAR | Status: AC
  Administered 2015-11-10 – 2015-11-11 (×3): 30 mg via INTRAVENOUS
  Filled 2015-11-10 (×3): qty 1

## 2015-11-10 MED ORDER — KETOROLAC TROMETHAMINE 30 MG/ML IJ SOLN
INTRAMUSCULAR | Status: DC | PRN
  Administered 2015-11-10: 30 mg via INTRAVENOUS

## 2015-11-10 MED ORDER — SUGAMMADEX SODIUM 200 MG/2ML IV SOLN
INTRAVENOUS | Status: AC
  Filled 2015-11-10: qty 2

## 2015-11-10 MED ORDER — DOCUSATE SODIUM 100 MG PO CAPS
100.0000 mg | ORAL_CAPSULE | Freq: Two times a day (BID) | ORAL | Status: DC
  Administered 2015-11-10: 100 mg via ORAL
  Filled 2015-11-10: qty 1

## 2015-11-10 MED ORDER — HYDROMORPHONE HCL 1 MG/ML IJ SOLN: 1.0000 mg | INTRAMUSCULAR | Status: DC | PRN

## 2015-11-10 MED ORDER — OXYCODONE-ACETAMINOPHEN 5-325 MG PO TABS
1.0000 | ORAL_TABLET | ORAL | Status: DC | PRN
  Administered 2015-11-10 – 2015-11-11 (×3): 2 via ORAL
  Filled 2015-11-10 (×3): qty 2

## 2015-11-10 MED ORDER — LIDOCAINE HCL (CARDIAC) 20 MG/ML IV SOLN
INTRAVENOUS | Status: DC | PRN
  Administered 2015-11-10: 30 mg via INTRAVENOUS
  Administered 2015-11-10: 70 mg via INTRAVENOUS

## 2015-11-10 MED ORDER — SODIUM CHLORIDE 0.9 % IJ SOLN
INTRAMUSCULAR | Status: AC
  Filled 2015-11-10: qty 100

## 2015-11-10 MED ORDER — FENTANYL CITRATE (PF) 100 MCG/2ML IJ SOLN
INTRAMUSCULAR | Status: AC
  Filled 2015-11-10: qty 2

## 2015-11-10 MED ORDER — ONDANSETRON HCL 4 MG/2ML IJ SOLN
INTRAMUSCULAR | Status: AC
  Filled 2015-11-10: qty 2

## 2015-11-10 MED ORDER — PROPOFOL 10 MG/ML IV BOLUS
INTRAVENOUS | Status: AC
  Filled 2015-11-10: qty 20

## 2015-11-10 MED ORDER — ROCURONIUM BROMIDE 100 MG/10ML IV SOLN
INTRAVENOUS | Status: DC | PRN
  Administered 2015-11-10: 35 mg via INTRAVENOUS
  Administered 2015-11-10: 15 mg via INTRAVENOUS
  Administered 2015-11-10: 5 mg via INTRAVENOUS
  Administered 2015-11-10: 15 mg via INTRAVENOUS
  Administered 2015-11-10: 5 mg via INTRAVENOUS

## 2015-11-10 MED ORDER — SCOPOLAMINE 1 MG/3DAYS TD PT72
1.0000 | MEDICATED_PATCH | Freq: Once | TRANSDERMAL | Status: DC
  Administered 2015-11-10: 1.5 mg via TRANSDERMAL

## 2015-11-10 MED ORDER — IBUPROFEN 600 MG PO TABS: 600.0000 mg | ORAL_TABLET | Freq: Four times a day (QID) | ORAL | Status: DC | PRN

## 2015-11-10 MED ORDER — VASOPRESSIN 20 UNIT/ML IV SOLN
INTRAVENOUS | Status: DC | PRN
  Administered 2015-11-10: 10 mL via INTRAMUSCULAR
  Administered 2015-11-10: 16 mL via INTRAMUSCULAR

## 2015-11-10 MED ORDER — FENTANYL CITRATE (PF) 100 MCG/2ML IJ SOLN
25.0000 ug | INTRAMUSCULAR | Status: DC | PRN
  Administered 2015-11-10 (×3): 50 ug via INTRAVENOUS

## 2015-11-10 MED ORDER — MIDAZOLAM HCL 2 MG/2ML IJ SOLN
INTRAMUSCULAR | Status: AC
  Filled 2015-11-10: qty 2

## 2015-11-10 MED ORDER — SUGAMMADEX SODIUM 200 MG/2ML IV SOLN
INTRAVENOUS | Status: DC | PRN
  Administered 2015-11-10: 175.4 mg via INTRAVENOUS

## 2015-11-10 MED ORDER — BUPIVACAINE HCL (PF) 0.25 % IJ SOLN
INTRAMUSCULAR | Status: DC | PRN
  Administered 2015-11-10: 10 mL
  Administered 2015-11-10: 20 mL

## 2015-11-10 MED ORDER — MEPERIDINE HCL 25 MG/ML IJ SOLN
6.2500 mg | INTRAMUSCULAR | Status: DC | PRN
  Administered 2015-11-10: 12.5 mg via INTRAVENOUS

## 2015-11-10 MED ORDER — ROCURONIUM BROMIDE 100 MG/10ML IV SOLN
INTRAVENOUS | Status: AC
  Filled 2015-11-10: qty 1

## 2015-11-10 MED ORDER — METOCLOPRAMIDE HCL 5 MG/ML IJ SOLN: 10.0000 mg | Freq: Once | INTRAMUSCULAR | Status: DC | PRN

## 2015-11-10 MED ORDER — FENTANYL CITRATE (PF) 100 MCG/2ML IJ SOLN
INTRAMUSCULAR | Status: DC | PRN
  Administered 2015-11-10: 25 ug via INTRAVENOUS
  Administered 2015-11-10 (×6): 50 ug via INTRAVENOUS
  Administered 2015-11-10: 25 ug via INTRAVENOUS

## 2015-11-10 MED ORDER — LACTATED RINGERS IV SOLN
INTRAVENOUS | Status: DC
  Administered 2015-11-10: 08:00:00 via INTRAVENOUS
  Administered 2015-11-10: 125 mL/h via INTRAVENOUS
  Administered 2015-11-10: 07:00:00 via INTRAVENOUS

## 2015-11-10 MED ORDER — KETOROLAC TROMETHAMINE 30 MG/ML IJ SOLN
INTRAMUSCULAR | Status: AC
  Filled 2015-11-10: qty 1

## 2015-11-10 MED ORDER — BUPIVACAINE HCL (PF) 0.25 % IJ SOLN
INTRAMUSCULAR | Status: AC
  Filled 2015-11-10: qty 30

## 2015-11-10 MED ORDER — PROPOFOL 10 MG/ML IV BOLUS
INTRAVENOUS | Status: DC | PRN
  Administered 2015-11-10: 40 mg via INTRAVENOUS
  Administered 2015-11-10: 200 mg via INTRAVENOUS

## 2015-11-10 MED ORDER — FENTANYL CITRATE (PF) 250 MCG/5ML IJ SOLN
INTRAMUSCULAR | Status: AC
  Filled 2015-11-10: qty 5

## 2015-11-10 SURGICAL SUPPLY — 46 items
BARRIER ADHS 3X4 INTERCEED (GAUZE/BANDAGES/DRESSINGS) ×3 IMPLANT
BLADE EXTENDED COATED 6.5IN (ELECTRODE) IMPLANT
CANISTER SUCT 3000ML (MISCELLANEOUS) ×3 IMPLANT
CLOTH BEACON ORANGE TIMEOUT ST (SAFETY) ×3 IMPLANT
CONT PATH 16OZ SNAP LID 3702 (MISCELLANEOUS) ×3 IMPLANT
DECANTER SPIKE VIAL GLASS SM (MISCELLANEOUS) ×6 IMPLANT
DRAPE CESAREAN BIRTH W POUCH (DRAPES) ×3 IMPLANT
DRAPE SURG 17X11 SM STRL (DRAPES) ×6 IMPLANT
DRSG OPSITE POSTOP 4X12 (GAUZE/BANDAGES/DRESSINGS) ×3 IMPLANT
DURAPREP 26ML APPLICATOR (WOUND CARE) ×3 IMPLANT
ELECT BLADE 6.5 EXT (BLADE) ×3 IMPLANT
ELECT CAUTERY BLADE 6.4 (BLADE) IMPLANT
ELECT NEEDLE TIP 2.8 STRL (NEEDLE) IMPLANT
GAUZE SPONGE 4X4 16PLY XRAY LF (GAUZE/BANDAGES/DRESSINGS) ×3 IMPLANT
GLOVE BIOGEL PI IND STRL 7.0 (GLOVE) ×1 IMPLANT
GLOVE BIOGEL PI INDICATOR 7.0 (GLOVE) ×2
GLOVE SURG SS PI 6.5 STRL IVOR (GLOVE) ×6 IMPLANT
GOWN STRL REUS W/TWL LRG LVL3 (GOWN DISPOSABLE) ×9 IMPLANT
LIQUID BAND (GAUZE/BANDAGES/DRESSINGS) IMPLANT
NEEDLE HYPO 22GX1.5 SAFETY (NEEDLE) ×3 IMPLANT
NEEDLE SPNL 22GX3.5 QUINCKE BK (NEEDLE) ×3 IMPLANT
PACK ABDOMINAL GYN (CUSTOM PROCEDURE TRAY) ×3 IMPLANT
PAD OB MATERNITY 4.3X12.25 (PERSONAL CARE ITEMS) ×3 IMPLANT
PENCIL SMOKE EVAC W/HOLSTER (ELECTROSURGICAL) ×3 IMPLANT
PROTECTOR NERVE ULNAR (MISCELLANEOUS) ×3 IMPLANT
RINGERS IRRIG 1000ML POUR BTL (IV SOLUTION) ×3 IMPLANT
STAPLER VISISTAT 35W (STAPLE) IMPLANT
SUT MNCRL AB 3-0 PS2 27 (SUTURE) ×3 IMPLANT
SUT PDS AB 0 CT 36 (SUTURE) IMPLANT
SUT VIC AB 0 CT1 18XCR BRD8 (SUTURE) ×2 IMPLANT
SUT VIC AB 0 CT1 27 (SUTURE) ×6
SUT VIC AB 0 CT1 27XBRD ANBCTR (SUTURE) ×2 IMPLANT
SUT VIC AB 0 CT1 8-18 (SUTURE) ×6
SUT VIC AB 2-0 CT1 27 (SUTURE) ×2
SUT VIC AB 2-0 CT1 TAPERPNT 27 (SUTURE) ×1 IMPLANT
SUT VIC AB 3-0 CT1 27 (SUTURE)
SUT VIC AB 3-0 CT1 TAPERPNT 27 (SUTURE) IMPLANT
SUT VIC AB 3-0 SH 27 (SUTURE) ×9
SUT VIC AB 3-0 SH 27X BRD (SUTURE) ×3 IMPLANT
SUT VICRYL 0 TIES 12 18 (SUTURE) IMPLANT
SUT VICRYL 2 0 18  UND BR (SUTURE)
SUT VICRYL 2 0 18 UND BR (SUTURE) IMPLANT
SYR CONTROL 10ML LL (SYRINGE) ×6 IMPLANT
TOWEL OR 17X24 6PK STRL BLUE (TOWEL DISPOSABLE) ×6 IMPLANT
TRAY FOLEY CATH SILVER 14FR (SET/KITS/TRAYS/PACK) ×3 IMPLANT
WATER STERILE IRR 1000ML POUR (IV SOLUTION) ×3 IMPLANT

## 2015-11-10 NOTE — Anesthesia Postprocedure Evaluation (Signed)
Anesthesia Post Note  Patient: Dominique Jones  Procedure(s) Performed: Procedure(s) (LRB): MYOMECTOMY, Left Salpingectomy, Excision of Incisional Mass (N/A)  Patient location during evaluation: PACU Anesthesia Type: General Level of consciousness: sedated Pain management: pain level controlled Vital Signs Assessment: post-procedure vital signs reviewed and stable Respiratory status: spontaneous breathing Cardiovascular status: stable Postop Assessment: no signs of nausea or vomiting Anesthetic complications: no     Last Vitals:  Vitals:   11/10/15 1115 11/10/15 1130  BP: 138/78 (!) 148/94  Pulse: 77 90  Resp: 17 15  Temp:      Last Pain:  Vitals:   11/10/15 1130  TempSrc:   PainSc: 7    Pain Goal: Patients Stated Pain Goal: 5 (11/10/15 0619)               Cassidy Tashiro JR,JOHN Mateo Flow

## 2015-11-10 NOTE — Op Note (Signed)
Procedure(s): MYOMECTOMY, Left Salpingectomy, Excision of Incisional Mass Procedure Note  Dominique Jones female 30 y.o. 11/10/2015  Procedure(s) and Anesthesia Type:    * MYOMECTOMY, Left Salpingectomy, Excision of Incisional Mass - General  Surgeon(s) and Role:    * Eldred Manges, MD - Primary   Indications: The patient had fibroids and left hydrosalpinx diagnosed at the time of removal of the right fallopian tube for a right tubal ectopic pregnancy in 2014. Over the last year. The patient has had increasingly severe symptoms thought to be associated with her fibroids and made the decision for myomectomy. The reproductive endocrinologist who performed her 2014 surgery had made the recommendation that she undergo left salpingectomy at the time of myomectomy because of the significant risk of repeat tubal ectopic pregnancy in the compromised left tube.  After careful consideration, the patient decided she would have both surgical procedures at this time.  Surgeon: Eldred Manges   Assistants: Earnstine Regal certified physician assistant  Anesthesia: General endotracheal anesthesia and Local anesthesia 0.25.% bupivacaine  ASA Class: 3    Procedure Detail  MYOMECTOMY, Left Salpingectomy, Excision of Incisional Mass  Findings: The uterus was enlarged to approximately 14 weeks size with a large posterior myoma and role. Fundal myomata. The the right fallopian tube was status post salpingectomy. The right ovary appeared normal. The left ovary contained a corpus luteum cyst but was otherwise within normal limits. The left fallopian tube was enlarged to several times normal caliber and had a clubbed fimbriated end. On entry into the abdominal incision. There was to the left of midline in the subcutaneous tissue a vascularized mass that could have been consistent with endometriosis. This was excised and sent as a separate specimen. Estimated Blood Loss:  100 cc         Drains: Foley  catheter         Total IV Fluids: 2000 cc   Blood Given: none          Specimens: Uterine myomata, the largest of which measured 9.5 x 6.5 cm. The remaining 4 myomata ranged in size from 1 cm to 3 cm. The left fallopian tube was dilated to several times its size and had a clubbed fimbriated end.         Implants: none        Complications:  non she is well on so spread out. Shee         Disposition: PACU - hemodynamically stable.         Condition: stable  Procedure: The patient was taken to the operating room after appropriate identification and placed on the operating table in the supine position. The abdomen was prepped with ChloraPrep and the perineum prepped with Betadine. A Foley catheter was inserted under sterile conditions and connected to straight drainage. A tiny timeout was undertaken. After drying of the ChloraPrep. The abdomen was draped as a sterile field. Suprapubic injection of 20 cc of quarter percent Marcaine at the site of the patient's previous transverse incision was done. A transverse incision was made and In the abdomen opened in layers. The aforementioned mass in the subcutaneous tissue was excised and sent as a separate specimen. Prior to incising the fascia. Once the peritoneal cavity had been accessed, lysis of adhesions was taken to allow elevation of the uterus into the operative field. The aforementioned findings were documented and a dilute solution of Pitressin used to infiltrate the posterior serosa, allowing access to the largest of the myomata and  2 additional myomata. The serosa was incised down to the level of the largest of the fibroids on the posterior surface. Donation of blunt and sharp dissection allowed removal of the fibroid intact. Through that same incision with a combination of blunt and sharp dissection. 2 additional myomata are were removed. Though the endometrial cavity was not clearly entered the surface of the largest myoma was directly underneath  that cavity with only a few cell layers of thickness remaining. The defect created by removal of these 3 myomata was closed with mattress sutures of 0 Vicryl up to the level of the serosa. Serosa was closed with a running suture of 3-0 Vicryl. 2 additional myomata that were just underneath the serosa and measuring approximately 2 cm were addressed by incising the serosa with dilute Pitressin, incising the serosa and removing the myomata. The defect was made hemostatic with cautery. The left fallopian tube was then freed from its adhesions to the posterior uterus and excised using electrocautery up to the level of the uterotubal junction. At that point, a Kelly clamp was used to excise the tube, and that connection was suture-ligated with 0 Vicryl. He was stasis was noted to be adequate and copious irrigation was carried out. Pieces of Interceed were placed over the serosal sutures and the uterus returned to the abdominal cavity. The abdominal peritoneum was closed with running suture of 2-0 Vicryl. The fascia was closed with running sutures of 0 Vicryl from each apex to the midline and tied in the midline. The subcutaneous tissue was made hemostatic with Bovie cautery and irrigated. It was reapproximated with sutures of 0 Vicryl. A subcuticular suture of 3-0 Monocryl was used to close the skin incision and Dermabond placed over the closure. She was awakened from general anesthesia and taken to the recovery room in satisfactory condition having tolerated the procedure well sponge and instrument counts correct.

## 2015-11-10 NOTE — Anesthesia Preprocedure Evaluation (Addendum)
Anesthesia Evaluation  Patient identified by MRN, date of birth, ID band Patient awake    Reviewed: Allergy & Precautions, H&P , NPO status , Patient's Chart, lab work & pertinent test results  History of Anesthesia Complications (+) DIFFICULT AIRWAY  Airway Mallampati: I  TM Distance: >3 FB Neck ROM: full    Dental no notable dental hx. (+) Teeth Intact   Pulmonary neg pulmonary ROS,    Pulmonary exam normal        Cardiovascular negative cardio ROS Normal cardiovascular exam     Neuro/Psych negative neurological ROS  negative psych ROS   GI/Hepatic negative GI ROS, Neg liver ROS,   Endo/Other  negative endocrine ROS  Renal/GU negative Renal ROS  negative genitourinary   Musculoskeletal negative musculoskeletal ROS (+)   Abdominal Normal abdominal exam  (+)   Peds  Hematology negative hematology ROS (+)   Anesthesia Other Findings   Reproductive/Obstetrics negative OB ROS                            Anesthesia Physical  Anesthesia Plan  ASA: II  Anesthesia Plan: General   Post-op Pain Management:    Induction: Intravenous  Airway Management Planned: Oral ETT and Video Laryngoscope Planned  Additional Equipment:   Intra-op Plan:   Post-operative Plan: Extubation in OR  Informed Consent: I have reviewed the patients History and Physical, chart, labs and discussed the procedure including the risks, benefits and alternatives for the proposed anesthesia with the patient or authorized representative who has indicated his/her understanding and acceptance.   Dental Advisory Given  Plan Discussed with: CRNA and Surgeon  Anesthesia Plan Comments: (H/o difficult intubation.)        Anesthesia Quick Evaluation

## 2015-11-10 NOTE — Anesthesia Postprocedure Evaluation (Signed)
Anesthesia Post Note  Patient: Dominique Jones  Procedure(s) Performed: * No procedures listed *  Patient location during evaluation: [facility] Anesthesia Type: General Level of consciousness: awake Pain management: satisfactory to patient Vital Signs Assessment: post-procedure vital signs reviewed and stable Respiratory status: spontaneous breathing Cardiovascular status: stable Anesthetic complications: no     Last Vitals:  Vitals:   11/10/15 1235 11/10/15 1340  BP: 126/89 129/78  Pulse: 86 86  Resp: 16 16  Temp: 37.4 C 36.9 C    Last Pain:  Vitals:   11/10/15 1340  TempSrc: Oral  PainSc:    Pain Goal: Patients Stated Pain Goal: 5 (11/10/15 [id number])               Casimer Lanius

## 2015-11-10 NOTE — H&P (Signed)
History and Physical Interval Note:   11/10/2015   7:17 AM   [name]  has presented today for surgery, with the diagnosis of Uterine Fibroids and left hydrosalpinx.   The various methods of treatment have been discussed with the patient and family. After consideration of risks, benefits and other options for treatment, the patient has consented to  Procedure(s): MYOMECTOMY and left salpingectomy as a surgical intervention .  HCG is less than 1 by verbal report from lab.   I have reviewed the patients' chart and labs.  Questions were answered to the patient's satisfaction.     Eldred Manges  MD

## 2015-11-10 NOTE — Anesthesia Procedure Notes (Signed)
Procedure Name: Intubation Date/Time: 11/10/2015 7:42 AM Performed by: Tobin Chad Pre-anesthesia Checklist: Patient identified, Emergency Drugs available, Patient being monitored, Suction available and Timeout performed Patient Re-evaluated:Patient Re-evaluated prior to inductionOxygen Delivery Method: Circle system utilized and Simple face mask Preoxygenation: Pre-oxygenation with 100% oxygen Intubation Type: IV induction and Inhalational induction Ventilation: Mask ventilation without difficulty Laryngoscope Size: Glidescope Grade View: Grade II Tube type: Oral Tube size: 6.5 mm Number of attempts: 2 Placement Confirmation: ETT inserted through vocal cords under direct vision,  positive ETCO2 and breath sounds checked- equal and bilateral Secured at: 22 cm Tube secured with: Tape Dental Injury: Teeth and Oropharynx as per pre-operative assessment  Difficulty Due To: Difficulty was anticipated

## 2015-11-10 NOTE — OR Nursing (Signed)
Attempted 2nd PIV in left arm  X2.  Unsuccessful.  Darlyn Chamber, CRNA will insert 2nd PIV in [place], RN

## 2015-11-10 NOTE — Transfer of Care (Signed)
Immediate Anesthesia Transfer of Care Note  Patient: Dominique Jones  Procedure(s) Performed: Procedure(s): MYOMECTOMY, Left Salpingectomy, Excision of Incisional Mass (N/A)  Patient Location: PACU  Anesthesia Type:General  Level of Consciousness: sedated and patient cooperative  Airway & Oxygen Therapy: Patient Spontanous Breathing and Patient connected to nasal cannula oxygen  Post-op Assessment: Report given to RN and Post -op Vital signs reviewed and stable  Post vital signs: Reviewed and stable  Last Vitals:  Vitals:   11/10/15 0619  BP: (!) 148/104  Pulse: 88  Resp: 18  Temp: 37.2 C    Last Pain:  Vitals:   11/10/15 0619  TempSrc: Oral      Patients Stated Pain Goal: 5 ([id number])  Complications: No apparent anesthesia complications

## 2015-11-10 NOTE — Progress Notes (Signed)
Day of Surgery Procedure(s) (LRB): MYOMECTOMY, Left Salpingectomy, Excision of Incisional Mass (N/A)  Subjective: Patient reports minimal  Nausea, no vomiting.   Incisional pain well controlled with single dose of po pain meds.  Is tolerating PO.    Objective: I have reviewed patient's vital signs, intake and output and medications.  General: alert and cooperative Resp: clear to auscultation bilaterally Cardio: regular rate and rhythm, S1, S2 normal, no murmur, click, rub or gallop GI: soft, non-tender; bowel sounds normal; no masses,  no organomegaly.  Dressing dry Extremities: extremities normal, atraumatic, no cyanosis or edema Vaginal Bleeding: minimal No intake/output data recorded. Total I/O In: 2100 [I.V.:2100] Out: 8569 [Urine:1350; Blood:100]   Assessment: s/p Procedure(s): MYOMECTOMY, Left Salpingectomy, Excision of Incisional Mass (N/A): stable, progressing well and tolerating diet  Plan: Advance diet Encourage ambulation Advance to PO medication Discharge home in am if all benchmarks are met  LOS: 0 days    Vlad Mayberry P 11/10/2015, 6:55 PM

## 2015-11-11 ENCOUNTER — Encounter (HOSPITAL_COMMUNITY): Payer: Self-pay | Admitting: Obstetrics and Gynecology

## 2015-11-11 LAB — CBC
HEMATOCRIT: 31.5 % — AB (ref 36.0–46.0)
Hemoglobin: 10.7 g/dL — ABNORMAL LOW (ref 12.0–15.0)
MCH: 27.8 pg (ref 26.0–34.0)
MCHC: 34 g/dL (ref 30.0–36.0)
MCV: 81.8 fL (ref 78.0–100.0)
PLATELETS: 267 10*3/uL (ref 150–400)
RBC: 3.85 MIL/uL — ABNORMAL LOW (ref 3.87–5.11)
RDW: 15.6 % — AB (ref 11.5–15.5)
WBC: 8.5 10*3/uL (ref 4.0–10.5)

## 2015-11-11 MED ORDER — OXYCODONE-ACETAMINOPHEN 5-325 MG PO TABS: 1.0000 | ORAL_TABLET | Freq: Four times a day (QID) | ORAL | 0 refills | Status: DC | PRN

## 2015-11-11 MED ORDER — IBUPROFEN 600 MG PO TABS: ORAL_TABLET | ORAL | 1 refills | Status: DC

## 2015-11-11 NOTE — Discharge Instructions (Signed)
Call [facility] @ [phone] if:  You have a temperature greater than or equal to 100.4 degrees Farenheit orally You have pain that is not made better by the pain medication given and taken as directed You have excessive bleeding or problems urinating  Take Colace (Docusate Sodium/Stool Softener) 100 mg 2-3 times daily while taking narcotic pain medicine to avoid constipation or until bowel movements are regular. Take Ibuprofen 600 mg with food every 6 hours for 5 days then as needed for pain Take your iron supplement, twice daily for the next 12 weeks  You may drive after 2 weeks You may walk up steps  You may shower  You may resume a regular diet  Keep incisions clean and dry; Remove honeycomb dressing on November 17, 2015 Do not lift over 15 pounds for 6 weeks Avoid anything in vagina for 6 weeks (or until after your post-operative visit)

## 2015-11-11 NOTE — Progress Notes (Signed)
Dominique Jones is a49 y.o.  [id number]  Post Op Date # 1: Abdominal Myomectomy/ Left Salpingectomy/Excision of Incisional Mass  Subjective: Patient is Doing well postoperatively. Patient has Pain is controlled with current analgesics. Medications being used: prescription NSAID's including Ketorolac 30 mg IV and narcotic analgesics including Percocet 5/325. Ambulating in the halls without dizziness and   tolerating po without nausea. Hasn't voided since Foley removed at 6 a.m. and hasn't passed flatus but "feels like" she will soon.   Objective: Vital signs in last 24 hours: Temp:  [98.3 F (36.8 C)-99.3 F (37.4 C)] 98.3 F (36.8 C) (09/20 0535) Pulse Rate:  [63-105] 66 (09/20 0535) Resp:  [14-21] 18 (09/20 0535) BP: (106-148)/(66-94) 106/77 (09/20 0535) SpO2:  [97 %-100 %] 98 % (09/20 0535) Weight:  [193 lb 4 oz (87.7 kg)] 193 lb 4 oz (87.7 kg) (09/19 2021)  Intake/Output from previous day: 09/19 0701 - 09/20 0700 In: 4924.2 [P.O.:710; I.V.:4214.2] Out: 2075 [Urine:1975] I Recent Labs Lab 11/11/15 0556  WBC 8.5  HGB 10.7*  HCT 31.5*  PLT 267     EXAM: General: alert, cooperative and no distress Resp: clear to auscultation bilaterally Cardio: regular rate and rhythm, S1, S2 normal, no murmur, click, rub or gallop GI: Bowel sounds present,  soft and dressing is clean, dry and intact. Extremities: Homans sign is negative, no sign of DVT and SCD hose in place and functioning-no calf tenderness.   Assessment: s/p Procedure(s): MYOMECTOMY, Left Salpingectomy, Excision of Incisional Mass: stable, progressing well, tolerating diet and anemia  Plan: Advance diet.  Tolerated regular diet last night Awaiting patient to void  Plans to discharge home later today  LOS: 1 day    POWELL,ELMIRA, PA-C 11/11/2015 7:05 AM

## 2015-11-11 NOTE — Progress Notes (Signed)
Pt. Is discharged in the care of friend,with R.n. Escort, Denies pain or discomfort. Understands all discharged instructions well Questions were asked and answeredAbdominal dressing is clean and dry. Stable.Marland Kitchen

## 2015-11-11 NOTE — Discharge Summary (Signed)
Physician Discharge Summary  Patient ID: Dominique Jones MRN: [id number] DOB/AGE: [birth year] 30 y.o.  Admit date: 11/10/2015 Discharge date: 11/11/2015   Discharge Diagnoses: Symptomatic Uterine Fibroids, Left Hydrosalpinx and Pelvic Pain Active Problems:   Fibroids   Hydrosalpinx   Operation: Abdominal Myomectomy, Excision of Incisional Mass and Left Salpingectomy   Discharged Condition: Good  Hospital Course: On the date of admission the patient underwent the aforementioned procedures and tolerated them well.  Post operative course was  unremarkable with the patient tolerating a hemoglobin of 10.7 and resuming bowel and bladder function by post operative day #1.  Having received the maximum benefit of her hospital stay,  the patient was discharged home on post operative day #1.  Disposition: 01-Home or Self Care  Discharge Medications:    Medication List    STOP taking these medications   amoxicillin-clavulanate 400-57 MG/5ML suspension Commonly known as:  AUGMENTIN   naproxen 500 MG tablet Commonly known as:  NAPROSYN     TAKE these medications   ferrous sulfate 325 (65 FE) MG tablet Commonly known as:  FERROUSUL Take 1 tablet (325 mg total) by mouth 2 (two) times daily.   ibuprofen 600 MG tablet Commonly known as:  ADVIL,MOTRIN 1  po  pc every 6 hours for 5 days then prn-pain   ondansetron 4 MG tablet Commonly known as:  ZOFRAN Take 1 tablet (4 mg total) by mouth every 6 (six) hours as needed for nausea.   oxyCODONE-acetaminophen 5-325 MG tablet Commonly known as:  ROXICET Take 1-2 tablets by mouth every 6 (six) hours as needed for severe pain. What changed:  when to take this        Follow-up: Dr. Lorriane Shire P. Haygood on December 15, 2015 at 1 p.m.   SignedEarnstine Regal, PA-C 11/11/2015, 7:12 AM

## 2016-03-22 DIAGNOSIS — L723 Sebaceous cyst: Secondary | ICD-10-CM | POA: Insufficient documentation

## 2016-08-30 ENCOUNTER — Encounter (HOSPITAL_COMMUNITY): Payer: Self-pay | Admitting: Physician Assistant

## 2016-08-30 ENCOUNTER — Ambulatory Visit (HOSPITAL_COMMUNITY)
Admission: EM | Admit: 2016-08-30 | Discharge: 2016-08-30 | Disposition: A | Discharge status: Home / Self Care | Payer: 59 | Attending: Family Medicine | Admitting: Family Medicine

## 2016-08-30 DIAGNOSIS — J029 Acute pharyngitis, unspecified: Secondary | ICD-10-CM

## 2016-08-30 LAB — POCT RAPID STREP A: STREPTOCOCCUS, GROUP A SCREEN (DIRECT): NEGATIVE

## 2016-08-30 MED ORDER — PENICILLIN V POTASSIUM 500 MG PO TABS: 500.0000 mg | ORAL_TABLET | Freq: Three times a day (TID) | ORAL | 0 refills | Status: DC

## 2016-08-30 MED ORDER — ACETAMINOPHEN 325 MG PO TABS
650.0000 mg | ORAL_TABLET | Freq: Once | ORAL | Status: AC
  Administered 2016-08-30: 650 mg via ORAL

## 2016-08-30 MED ORDER — ACETAMINOPHEN 325 MG PO TABS
ORAL_TABLET | ORAL | Status: AC
  Filled 2016-08-30: qty 2

## 2016-08-30 NOTE — ED Provider Notes (Signed)
CSN: [id number]     Arrival date & time 08/30/16  1507 History   None    Chief Complaint  Patient presents with  . URI   (Consider location/radiation/quality/duration/timing/severity/associated sxs/prior Treatment) 31 year old female comes in with two-day history of body aches, sore throat, fever. She states she started feeling sick yesterday, took Tylenol PM to sleep. She has also had some headaches. Tmax of 102 at home. She has tried over-the-counter flu medicine with no relief. Denies cough, rhinorrhea, ear pain, eye pain. Denies abdominal pain, nausea, vomiting, diarrhea. Denies urinary frequency, dysuria, hematuria. Denies trouble breathing, wheezing, swelling of the throat, trouble swallowing. No sick contact.      Past Medical History:  Diagnosis Date  . Complication of anesthesia    difficult intubation  . Ectopic pregnancy   . Glaucoma    follows up with opthalmologist once a year for pressure check   Past Surgical History:  Procedure Laterality Date  . LAPAROSCOPY N/A 12/26/2012   Procedure: LAPAROSCOPY OPERATIVE REMOVAL OF ECTOPIC PREGNANCY;  Surgeon: Governor Specking, MD;  Location: [facility];  Service: Gynecology;  Laterality: N/A;  Attempted  . LAPAROTOMY N/A 12/26/2012   Procedure: LAPAROTOMY;  Surgeon: Governor Specking, MD;  Location: [facility];  Service: Gynecology;  Laterality: N/A;  . MYOMECTOMY N/A 11/10/2015   Procedure: MYOMECTOMY, Left Salpingectomy, Excision of Incisional Mass;  Surgeon: Eldred Manges, MD;  Location: [facility];  Service: Gynecology;  Laterality: N/A;  . OPERATIVE ULTRASOUND Right 12/14/2012   Procedure: OPERATIVE ULTRASOUND GUIDED INJECTION OF POTASSIUM CHLORIDE 2.5ML;  Surgeon: Governor Specking, MD;  Location: [facility];  Service: Gynecology;  Laterality: Right;  . REFRACTIVE SURGERY    . UNILATERAL SALPINGECTOMY Right 12/26/2012   Procedure: UNILATERAL SALPINGECTOMY;  Surgeon: Governor Specking, MD;  Location: [facility];  Service:  Gynecology;  Laterality: Right;   No family history on file. Social History  Substance Use Topics  . Smoking status: Never Smoker  . Smokeless tobacco: Never Used  . Alcohol use Yes     Comment: socially   OB History    Gravida Para Term Preterm AB Living   1 0 0 0 1 0   SAB TAB Ectopic Multiple Live Births   0 0 1 0       Review of Systems  Constitutional: Negative for chills, diaphoresis and fever.  HENT: Positive for sore throat. Negative for congestion, postnasal drip, rhinorrhea, sinus pain, sinus pressure, trouble swallowing and voice change.   Eyes: Negative for pain, discharge, redness and itching.  Respiratory: Negative for cough, shortness of breath and wheezing.   Cardiovascular: Negative for chest pain and palpitations.  Gastrointestinal: Negative for abdominal pain, diarrhea, nausea and vomiting.  Genitourinary: Negative for difficulty urinating, dysuria, frequency and hematuria.  Musculoskeletal: Positive for arthralgias and myalgias. Negative for joint swelling.    Allergies  Patient has no known allergies.  Home Medications   Prior to Admission medications   Medication Sig Start Date End Date Taking? Authorizing Provider  ferrous sulfate (FERROUSUL) 325 (65 FE) MG tablet Take 1 tablet (325 mg total) by mouth 2 (two) times daily. Patient not taking: Reported on 05/03/2015 12/15/12   Woodroe Mode, MD  ibuprofen (ADVIL,MOTRIN) 600 MG tablet 1  po  pc every 6 hours for 5 days then prn-pain 11/11/15   Earnstine Regal, PA-C  ondansetron (ZOFRAN) 4 MG tablet Take 1 tablet (4 mg total) by mouth every 6 (six) hours as needed for nausea. Patient not taking: Reported on  05/03/2015 12/15/12   Rolla Plate, MD  oxyCODONE-acetaminophen (ROXICET) 5-325 MG tablet Take 1-2 tablets by mouth every 6 (six) hours as needed for severe pain. 11/11/15   Earnstine Regal, PA-C  penicillin v potassium (VEETID) 500 MG tablet Take 1 tablet (500 mg total) by mouth 3 (three) times daily.  08/30/16   Ok Edwards, PA-C   Meds Ordered and Administered this Visit   Medications  acetaminophen (TYLENOL) tablet 650 mg (650 mg Oral Given 08/30/16 1554)    BP 139/89 (BP Location: Left Arm)   Pulse (!) 101 Comment: notified  rn  Temp (!) 101.5 F (38.6 C) (Oral) Comment: notified rn  Resp 16   LMP 08/21/2016   SpO2 100%  No data found.   Physical Exam  Constitutional: She is oriented to person, place, and time. She appears well-developed and well-nourished. No distress.  HENT:  Head: Normocephalic and atraumatic.  Right Ear: External ear and ear canal normal. Tympanic membrane is erythematous. Tympanic membrane is not bulging.  Left Ear: Tympanic membrane, external ear and ear canal normal. Tympanic membrane is not erythematous and not bulging.  Nose: Mucosal edema and rhinorrhea present. Right sinus exhibits no maxillary sinus tenderness and no frontal sinus tenderness. Left sinus exhibits no maxillary sinus tenderness and no frontal sinus tenderness.  Mouth/Throat: Uvula is midline and mucous membranes are normal. Posterior oropharyngeal erythema present. Tonsils are 1+ on the right. Tonsils are 1+ on the left. Tonsillar exudate.  Eyes: Conjunctivae are normal. Pupils are equal, round, and reactive to light.  Neck: Normal range of motion. Neck supple.  Cardiovascular: Normal rate, regular rhythm and normal heart sounds.  Exam reveals no gallop and no friction rub.   No murmur heard. Pulmonary/Chest: Effort normal and breath sounds normal. She has no decreased breath sounds. She has no wheezes. She has no rhonchi. She has no rales.  Lymphadenopathy:    She has no cervical adenopathy.  Neurological: She is alert and oriented to person, place, and time.  Skin: Skin is warm and dry.  Psychiatric: She has a normal mood and affect. Her behavior is normal. Judgment normal.    Urgent Care Course     Procedures (including critical care time)  Labs Review Labs Reviewed  POCT  RAPID STREP A    Imaging Review No results found.     MDM   1. Acute pharyngitis, unspecified etiology    Discussed results with the patient, rapid strep negative. Culture sent, she'll be contacted with any positive results. Additional treatment will be sent in then. Given presentation and exam, will treat with penicillin 500 mg 3 times a day 10 days. Tylenol/Motrin for fever. Push fluids. Side effects of medicines discussed with patient. If notice any allergies to medicine, to stop medicines and contact PCP. Monitor for trouble swallowing, trouble breathing, swelling of the throat, to go to the ED for further evaluation.    Ok Edwards, PA-C 08/30/16 1622

## 2016-08-30 NOTE — Discharge Instructions (Signed)
Your rapid strep was negative today, culture has been sent, and you will be contacted with any positive results. Additional treatment needed to be sent in then. Given your presentation, I am treating you empirically with penicillin 500 mg 3 times a day for 10 days. Tylenol/Motrin for fever and pain. Drink lots of fluids, your urine should be clear/pale yellow in color. Discard toothbrush after 24 hours on antibiotics. You can go back to work after 24 hours on antibiotics, or 24 hours fever free. Monitor for trouble swallowing, trouble breathing, swelling of the throat, to go to the ED for further evaluation.

## 2016-08-30 NOTE — ED Triage Notes (Addendum)
Woke yesterday with feeling bad, general aches, sore throat, has felt hot.  Patient unable to sleep well last night and reports temp 102 during the night.  Throat is very dry

## 2016-09-02 LAB — CULTURE, GROUP A STREP (THRC)

## 2018-02-14 IMAGING — CT CT NECK W/ CM
4 of 5 series · 16 of 33 positions shown, 18 images · IV contrast (omnipaque)
Comparison: None.

CLINICAL DATA: RIGHT cheek swelling for 1 hour prior to arrival.

EXAM:
CT NECK WITH CONTRAST
TECHNIQUE: Multidetector CT imaging of the neck was performed using the
standard protocol following the bolus administration of intravenous
contrast.
CONTRAST:  75mL OMNIPAQUE IOHEXOL 300 MG/ML  SOLN

[Series 3: neck with st · axial · 0.44mm/px · z∈[-244,-106]mm · 4 of 116 slices shown, 5 images]
[im 24/116  soft-tissue]
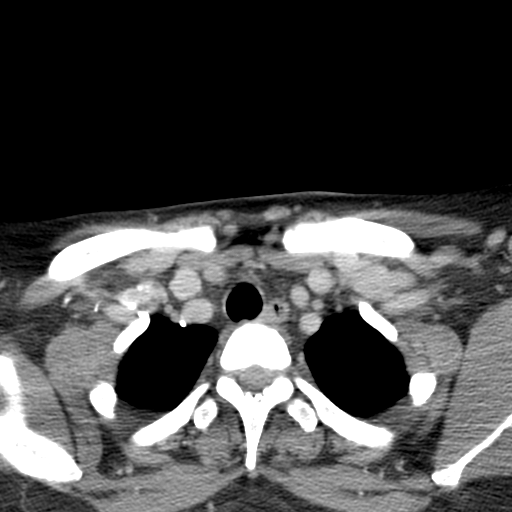
[im 24/116  bone]
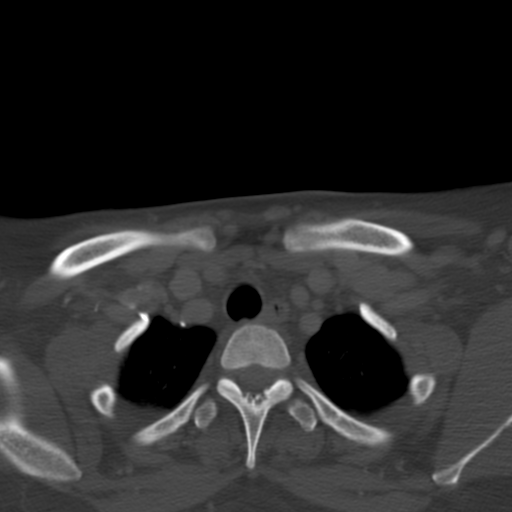
[im 47/116  bone]
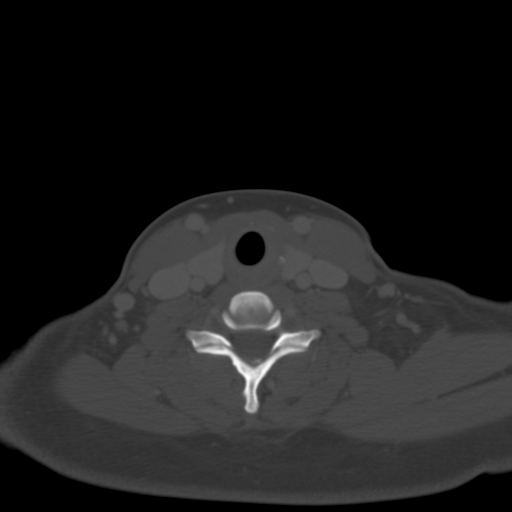
[im 70/116  bone]
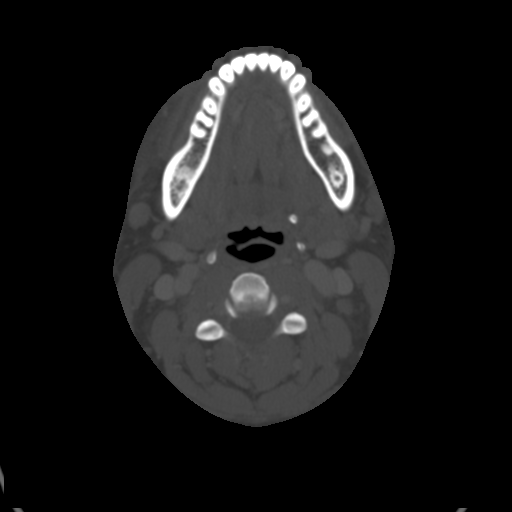
[im 93/116  bone]
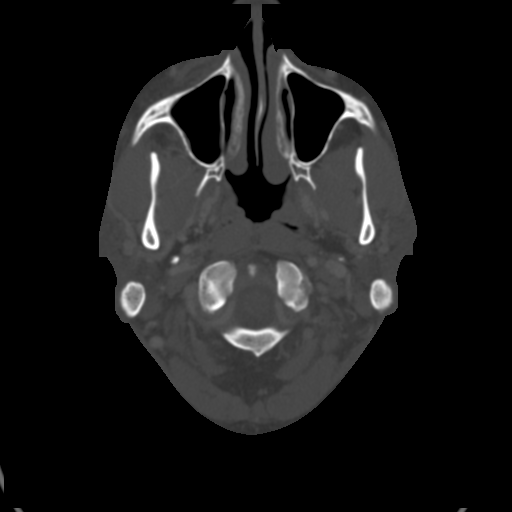

[Series 6: coronal st · coronal · 0.50mm/px · 3 of 100 slices shown]
[im 26/100  bone]
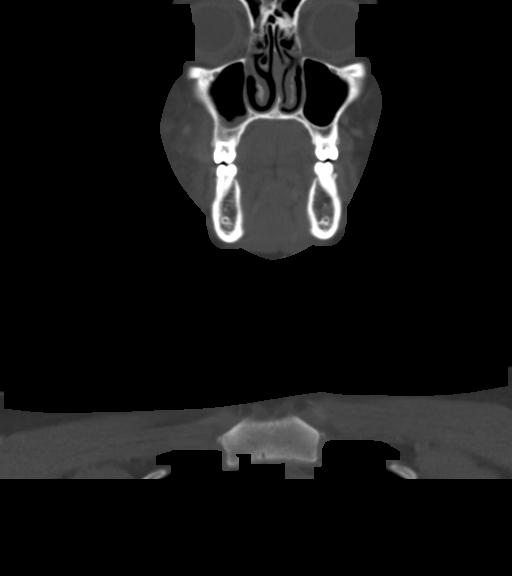
[im 42/100  bone]
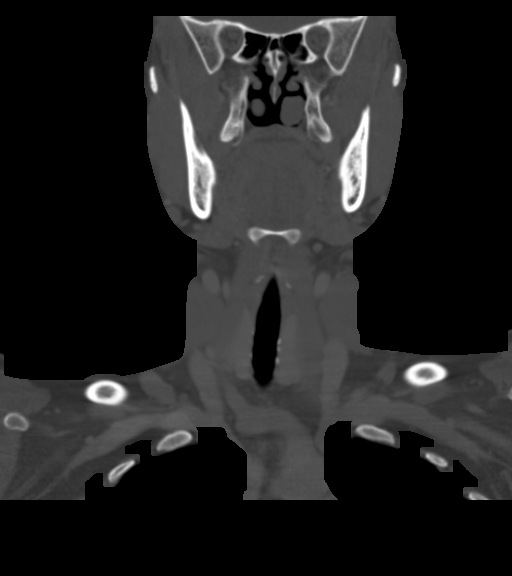
[im 58/100  bone]
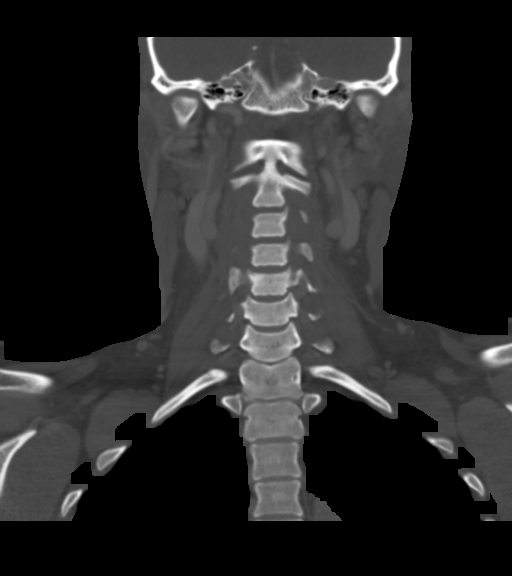

[Series 7: sagittal st · sagittal · 0.46mm/px · 5 of 81 slices shown, 6 images]
[im 27/81  bone]
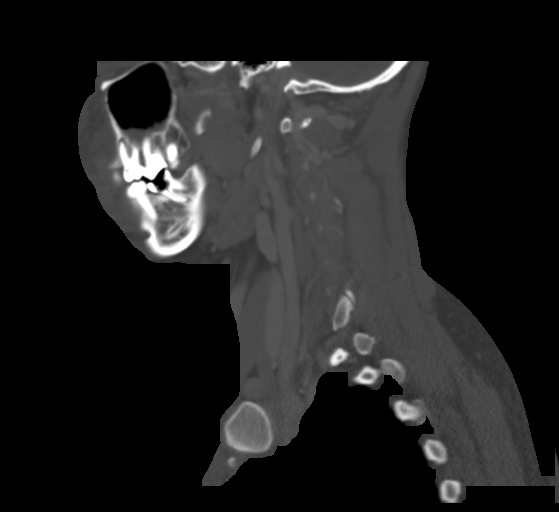
[im 34/81  bone]
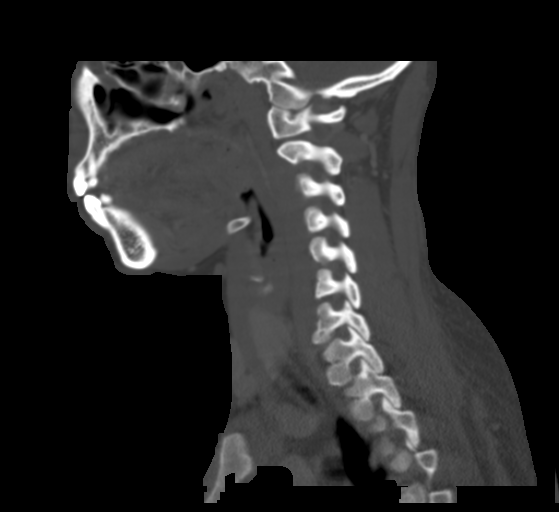
[im 41/81  soft-tissue]
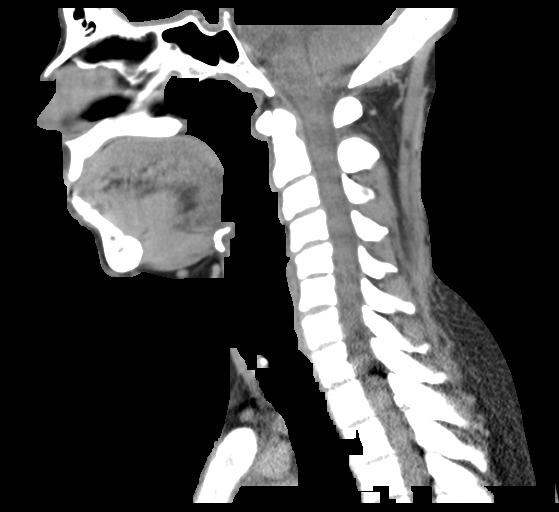
[im 41/81  bone]
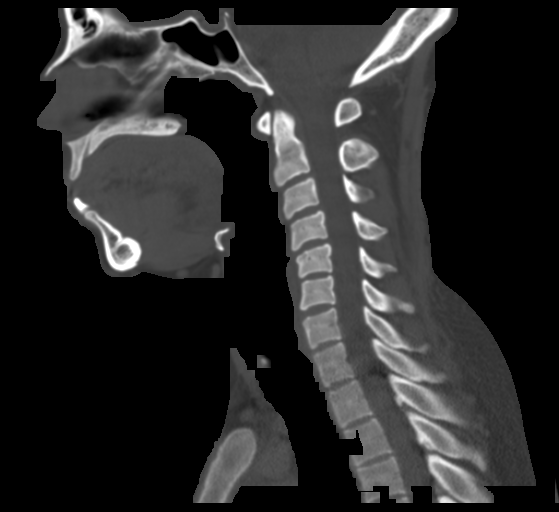
[im 47/81  bone]
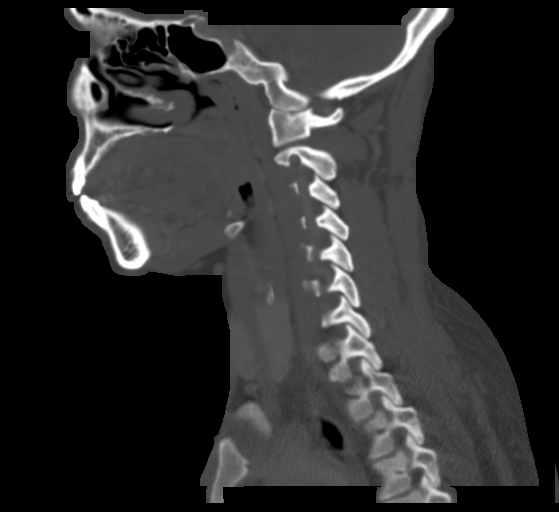
[im 54/81  bone]
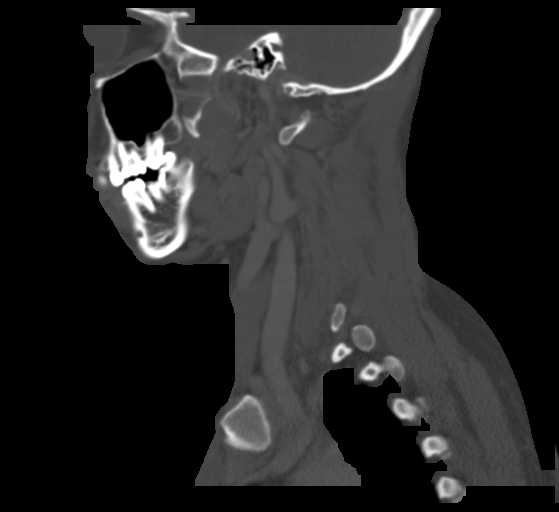

[Series 8: axial recons · axial · 0.39mm/px · z∈[-280,-138]mm · 4 of 124 slices shown]
[im 25/124  bone]
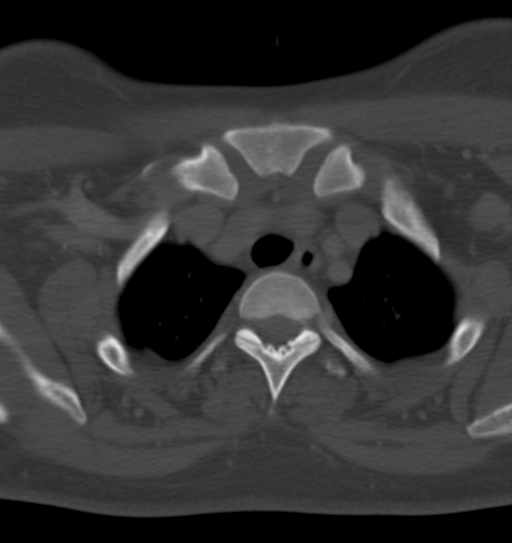
[im 50/124  bone]
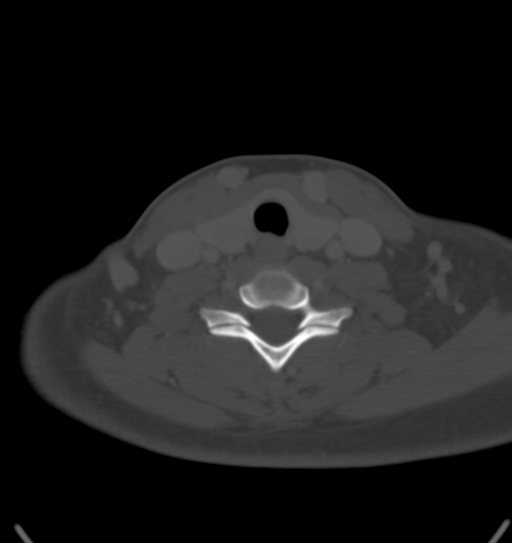
[im 74/124  bone]
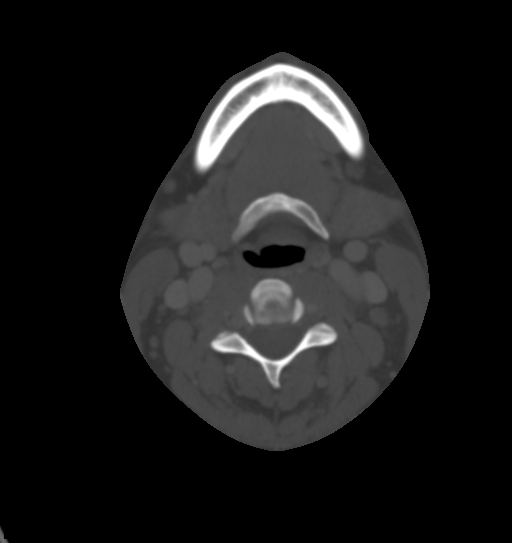
[im 99/124  bone]
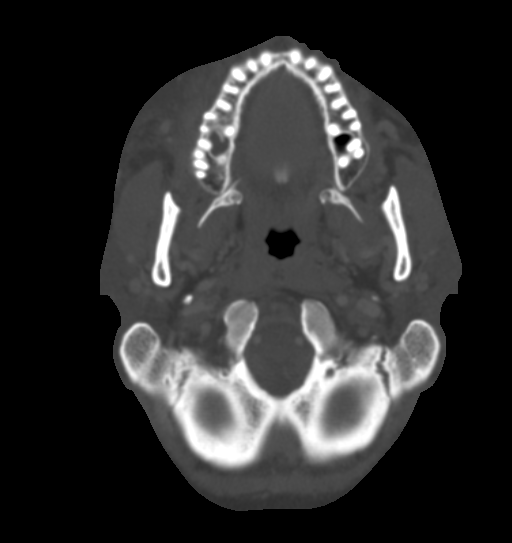

[16 of 33 positions shown; findings below may reference images not displayed]

FINDINGS: Pharynx and larynx: Normal.

Salivary glands: Normal.

Thyroid: Normal.

Lymph nodes: No lymphadenopathy by CT size criteria.

Vascular: Normal.

Limited intracranial: Normal.

Visualized orbits: Normal.

Mastoids and visualized paranasal sinuses: Normal.

Skeleton/soft tissue: RIGHT mid and lower facial soft tissue
swelling, reticulated subcutaneous fat with 4 mm RIGHT buccal
abscess associated with periapical lucency/ abscess third tooth.
Multiple dental caries.

Upper chest: Lung apices are clear. No superior mediastinal
lymphadenopathy.
IMPRESSION: RIGHT facial cellulitis with superimposed 4 mm buccal abscess
contiguous with third tooth periapical lucency/abscess. Generalized
poor dentition.

## 2019-02-18 ENCOUNTER — Emergency Department (HOSPITAL_COMMUNITY)
Admission: EM | Admit: 2019-02-18 | Discharge: 2019-02-18 | Disposition: A | Discharge status: Home / Self Care | Payer: 59 | Attending: Emergency Medicine | Admitting: Emergency Medicine

## 2019-02-18 ENCOUNTER — Encounter (HOSPITAL_COMMUNITY): Payer: Self-pay | Admitting: Emergency Medicine

## 2019-02-18 DIAGNOSIS — R599 Enlarged lymph nodes, unspecified: Secondary | ICD-10-CM | POA: Diagnosis not present

## 2019-02-18 DIAGNOSIS — R600 Localized edema: Secondary | ICD-10-CM | POA: Diagnosis present

## 2019-02-18 DIAGNOSIS — L03811 Cellulitis of head [any part, except face]: Secondary | ICD-10-CM | POA: Diagnosis not present

## 2019-02-18 MED ORDER — DOXYCYCLINE HYCLATE 100 MG PO CAPS: 100.0000 mg | ORAL_CAPSULE | Freq: Two times a day (BID) | ORAL | 0 refills | Status: DC

## 2019-02-18 NOTE — ED Triage Notes (Signed)
Patient here from home with complaints of left sided facial swelling including eye since Friday. Benadryl with no relief.

## 2019-02-18 NOTE — ED Notes (Signed)
Pt verbalized discharge instructions and follow up care. Alert and ambulatory. No IV.  
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
## 2019-02-18 NOTE — Discharge Instructions (Addendum)
Take antibiotics as prescribed and complete the full course. Apply warm compresses to the left side of the face and ear for 20 days at a time. Follow-up with ear nose and throat doctor, referral given, or see a primary care physician.

## 2019-02-21 DIAGNOSIS — R591 Generalized enlarged lymph nodes: Secondary | ICD-10-CM | POA: Insufficient documentation

## 2019-03-19 ENCOUNTER — Institutional Professional Consult (permissible substitution): Payer: 59 | Admitting: Plastic Surgery

## 2020-01-03 DIAGNOSIS — Q150 Congenital glaucoma: Secondary | ICD-10-CM | POA: Insufficient documentation

## 2020-07-30 DIAGNOSIS — M329 Systemic lupus erythematosus, unspecified: Secondary | ICD-10-CM | POA: Diagnosis not present

## 2020-07-30 DIAGNOSIS — E538 Deficiency of other specified B group vitamins: Secondary | ICD-10-CM | POA: Diagnosis not present

## 2020-07-30 DIAGNOSIS — L931 Subacute cutaneous lupus erythematosus: Secondary | ICD-10-CM | POA: Diagnosis not present

## 2020-07-30 DIAGNOSIS — R5383 Other fatigue: Secondary | ICD-10-CM | POA: Diagnosis not present

## 2020-07-30 DIAGNOSIS — E559 Vitamin D deficiency, unspecified: Secondary | ICD-10-CM | POA: Diagnosis not present

## 2020-08-05 ENCOUNTER — Telehealth: Payer: Self-pay | Admitting: Hematology and Oncology

## 2020-08-05 NOTE — Telephone Encounter (Signed)
Scheduled appt per 6/14 referral. Pt aware.

## 2020-08-17 ENCOUNTER — Inpatient Hospital Stay: Payer: BC Managed Care – PPO | Attending: Hematology and Oncology | Admitting: Hematology and Oncology

## 2020-08-17 ENCOUNTER — Inpatient Hospital Stay: Payer: BC Managed Care – PPO

## 2020-08-17 ENCOUNTER — Other Ambulatory Visit: Payer: Self-pay

## 2020-08-17 ENCOUNTER — Telehealth: Payer: Self-pay

## 2020-08-17 ENCOUNTER — Other Ambulatory Visit: Payer: Self-pay | Admitting: *Deleted

## 2020-08-17 VITALS — BP 152/96 | HR 95 | Temp 97.4°F | Resp 18 | Ht 66.0 in | Wt 199.0 lb

## 2020-08-17 DIAGNOSIS — D500 Iron deficiency anemia secondary to blood loss (chronic): Secondary | ICD-10-CM | POA: Insufficient documentation

## 2020-08-17 DIAGNOSIS — N920 Excessive and frequent menstruation with regular cycle: Secondary | ICD-10-CM | POA: Diagnosis not present

## 2020-08-17 DIAGNOSIS — R0602 Shortness of breath: Secondary | ICD-10-CM | POA: Diagnosis not present

## 2020-08-17 DIAGNOSIS — M329 Systemic lupus erythematosus, unspecified: Secondary | ICD-10-CM | POA: Insufficient documentation

## 2020-08-17 LAB — ABO/RH: ABO/RH(D): A POS

## 2020-08-17 LAB — CBC WITH DIFFERENTIAL (CANCER CENTER ONLY)
Abs Immature Granulocytes: 0.08 10*3/uL — ABNORMAL HIGH (ref 0.00–0.07)
Basophils Absolute: 0.1 10*3/uL (ref 0.0–0.1)
Basophils Relative: 1 %
Eosinophils Absolute: 0.2 10*3/uL (ref 0.0–0.5)
Eosinophils Relative: 3 %
HCT: 24.3 % — ABNORMAL LOW (ref 36.0–46.0)
Hemoglobin: 6.8 g/dL — CL (ref 12.0–15.0)
Immature Granulocytes: 1 %
Lymphocytes Relative: 29 %
Lymphs Abs: 2 10*3/uL (ref 0.7–4.0)
MCH: 17.4 pg — ABNORMAL LOW (ref 26.0–34.0)
MCHC: 28 g/dL — ABNORMAL LOW (ref 30.0–36.0)
MCV: 62.3 fL — ABNORMAL LOW (ref 80.0–100.0)
Monocytes Absolute: 0.6 10*3/uL (ref 0.1–1.0)
Monocytes Relative: 8 %
Neutro Abs: 3.9 10*3/uL (ref 1.7–7.7)
Neutrophils Relative %: 58 %
Platelet Count: 450 10*3/uL — ABNORMAL HIGH (ref 150–400)
RBC: 3.9 MIL/uL (ref 3.87–5.11)
RDW: 19.5 % — ABNORMAL HIGH (ref 11.5–15.5)
WBC Count: 6.8 10*3/uL (ref 4.0–10.5)
nRBC: 0 % (ref 0.0–0.2)

## 2020-08-17 LAB — RETIC PANEL
Immature Retic Fract: 32.4 % — ABNORMAL HIGH (ref 2.3–15.9)
RBC.: 3.87 MIL/uL (ref 3.87–5.11)
Retic Count, Absolute: 54.6 10*3/uL (ref 19.0–186.0)
Retic Ct Pct: 1.4 % (ref 0.4–3.1)
Reticulocyte Hemoglobin: 15.2 pg — ABNORMAL LOW (ref >27.9)

## 2020-08-17 LAB — CMP (CANCER CENTER ONLY)
ALT: 6 U/L (ref 0–44)
AST: 11 U/L — ABNORMAL LOW (ref 15–41)
Albumin: 3.8 g/dL (ref 3.5–5.0)
Alkaline Phosphatase: 60 U/L (ref 38–126)
Anion gap: 10 (ref 5–15)
BUN: 12 mg/dL (ref 6–20)
CO2: 23 mmol/L (ref 22–32)
Calcium: 8.4 mg/dL — ABNORMAL LOW (ref 8.9–10.3)
Chloride: 106 mmol/L (ref 98–111)
Creatinine: 0.79 mg/dL (ref 0.44–1.00)
GFR, Estimated: >60 mL/min (ref >60)
Glucose, Bld: 101 mg/dL — ABNORMAL HIGH (ref 70–99)
Potassium: 4.1 mmol/L (ref 3.5–5.1)
Sodium: 139 mmol/L (ref 135–145)
Total Bilirubin: 0.3 mg/dL (ref 0.3–1.2)
Total Protein: 7.5 g/dL (ref 6.5–8.1)

## 2020-08-17 LAB — SAMPLE TO BLOOD BANK

## 2020-08-17 LAB — LACTATE DEHYDROGENASE: LDH: 223 U/L — ABNORMAL HIGH (ref 98–192)

## 2020-08-17 NOTE — Telephone Encounter (Signed)
CRITICAL VALUE STICKER  CRITICAL VALUE: hemoglobin 6.8  RECEIVER (on-site recipient of call): Vickii Penna, RN  [place] NOTIFIED: 08/17/20 @ 1456  MESSENGER (representative from lab): Pam  MD NOTIFIED: Lorenso Courier  TIME OF NOTIFICATION: 2241  RESPONSE: aware

## 2020-08-17 NOTE — Progress Notes (Signed)
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 ESR 30, CRP 3. Vitamin B12 628, folate 9.0. 08/17/2020: establish care with Dr. Lorenso Courier  CHIEF COMPLAINTS/PURPOSE OF CONSULTATION:  "Anemia "  HISTORY OF PRESENTING ILLNESS:  Dominique Jones 35 y.o. female with medical history significant for recently diagnosed lupus, ectopic pregnancy who presents for evaluation of iron deficiency anemia.   On review of the previous records Dominique Jones last had labs in our system on 11/11/2015.  At that time she had a hemoglobin 10.7 with MCV of 81.8.  She had normal white blood cell count and platelets.  On 08/03/2020 the patient had a repeat CBC which showed a hemoglobin of 7.0 MCV of 65 ESR 30, CRP of 3 with a vitamin B12 of 628 and folate of 9.0.  Due to concern for the severe anemia the patient was referred to hematology for further evaluation and management.  On exam today Dominique Jones reports that she has been having issues with fatigue and shortness of breath.  She describes his fatigue as "extreme".  She was recently diagnosed with lupus which she believes may also be contributing to the symptoms.  She notes that the first sign she had of developing lupus was "yellowing of her palms".  She notes that she does have pretty substantial ice cravings which have been present for the last few months.  She does endorse having heavy menstrual cycles, particularly within the past 2 months.  She reports that she is "always cold".  She notes that her periods are regular and becoming even more irregular.  She reports that they can go on for about 7 days with some clotting and spotting afterwards.  She notes that her heaviest day she goes about 6-7 soaked pads.  She  is not on any restrictive diets and is able to eat red meat without restriction.  On further discussion her family history is remarkable for hypertension and type 2 diabetes in her mother.  Her sister is healthy and she has no children.  She smokes sparingly, with an occasional black and mild.  She also drinks occasional alcohol.  She notes that seeing an OB/GYN doctor is "the next thing on her list".  She otherwise denies any fevers, chills, sweats, nausea, vomiting or diarrhea.  Full 10 point ROS is listed below.  MEDICAL HISTORY:  Past Medical History:  Diagnosis Date   Complication of anesthesia    difficult intubation   Ectopic pregnancy    Glaucoma    follows up with opthalmologist once a year for pressure check    SURGICAL HISTORY: Past Surgical History:  Procedure Laterality Date   LAPAROSCOPY N/A 12/26/2012   Procedure: LAPAROSCOPY OPERATIVE REMOVAL OF ECTOPIC PREGNANCY;  Surgeon: Governor Specking, MD;  Location: [facility];  Service: Gynecology;  Laterality: N/A;  Attempted   LAPAROTOMY N/A 12/26/2012   Procedure: LAPAROTOMY;  Surgeon: Governor Specking, MD;  Location: [facility];  Service: Gynecology;  Laterality: N/A;   MYOMECTOMY N/A 11/10/2015   Procedure: MYOMECTOMY, Left Salpingectomy, Excision of Incisional Mass;  Surgeon: Eldred Manges, MD;  Location: [facility];  Service: Gynecology;  Laterality: N/A;   OPERATIVE ULTRASOUND Right 12/14/2012   Procedure: OPERATIVE ULTRASOUND GUIDED INJECTION OF POTASSIUM CHLORIDE 2.5ML;  Surgeon: Governor Specking, MD;  Location: [facility];  Service: Gynecology;  Laterality: Right;   REFRACTIVE SURGERY     UNILATERAL SALPINGECTOMY Right 12/26/2012   Procedure: UNILATERAL SALPINGECTOMY;  Surgeon: Governor Specking, MD;  Location: [facility];  Service: Gynecology;  Laterality: Right;    SOCIAL HISTORY: Social History   Socioeconomic History   Marital status: Single    Spouse name: Not on file   Number of children: Not on file    Years of education: Not on file   Highest education level: Not on file  Occupational History   Not on file  Tobacco Use   Smoking status: Never   Smokeless tobacco: Never  Substance and Sexual Activity   Alcohol use: Yes    Comment: socially   Drug use: No   Sexual activity: Yes    Birth control/protection: None  Other Topics Concern   Not on file  Social History Narrative   Not on file   Social Determinants of Health   Financial Resource Strain: Not on file  Food Insecurity: Not on file  Transportation Needs: Not on file  Physical Activity: Not on file  Stress: Not on file  Social Connections: Not on file  Intimate Partner Violence: Not on file    FAMILY HISTORY: No family history on file.  ALLERGIES:  has No Known Allergies.  MEDICATIONS:  Current Outpatient Medications  Medication Sig Dispense Refill   ibuprofen (ADVIL,MOTRIN) 600 MG tablet 1  po  pc every 6 hours for 5 days then prn-pain (Patient taking differently: 1  po  pc every 6 hours for 5 days then prn-pain) 30 tablet 1   acetaminophen (TYLENOL) 500 MG tablet Take 1,000 mg by mouth every 6 (six) hours as needed for moderate pain.     diphenhydrAMINE (BENADRYL) 25 MG tablet Take 25 mg by mouth every 6 (six) hours as needed for allergies.     hydroxychloroquine (PLAQUENIL) 200 MG tablet hydroxychloroquine 200 mg tablet     Vitamin D, Ergocalciferol, (DRISDOL) 1.25 MG (50000 UNIT) CAPS capsule Take 50,000 Units by mouth once a week.     No current facility-administered medications for this visit.    REVIEW OF SYSTEMS:   Constitutional: ( - ) fevers, ( - )  chills , ( - ) night sweats Eyes: ( - ) blurriness of vision, ( - ) double vision, ( - ) watery eyes Ears, nose, mouth, throat, and face: ( - ) mucositis, ( - ) sore throat Respiratory: ( - ) cough, ( - ) dyspnea, ( - ) wheezes Cardiovascular: ( - ) palpitation, ( - ) chest discomfort, ( - ) lower extremity swelling Gastrointestinal:  ( - ) nausea, ( - )  heartburn, ( - ) change in bowel habits Skin: ( - ) abnormal skin rashes Lymphatics: ( - ) new lymphadenopathy, ( - ) easy bruising Neurological: ( - ) numbness, ( - ) tingling, ( - ) new weaknesses Behavioral/Psych: ( - ) mood change, ( - ) new changes  All other systems were reviewed with the patient and are negative.  PHYSICAL EXAMINATION:  Vitals:   08/17/20 1329  BP: (!) 152/96  Pulse: 95  Resp: 18  Temp: (!) 97.4 F (36.3 C)  SpO2: 100%   Filed Weights   08/17/20 1329  Weight: 199 lb (90.3 kg)    GENERAL: well appearing young African American in NAD  SKIN: skin color, texture, turgor are normal, no rashes or significant lesions EYES: conjunctiva are pink and non-injected, sclera  clear LUNGS: clear to auscultation and percussion with normal breathing effort HEART: regular rate & rhythm and no murmurs and no lower extremity edema PSYCH: alert & oriented x 3, fluent speech NEURO: no focal motor/sensory deficits  LABORATORY DATA:  I have reviewed the data as listed CBC Latest Ref Rng & Units 08/17/2020 11/11/2015 11/02/2015  WBC 4.0 - 10.5 K/uL 6.8 8.5 5.1  Hemoglobin 12.0 - 15.0 g/dL 6.8(LL) 10.7(L) 12.3  Hematocrit 36.0 - 46.0 % 24.3(L) 31.5(L) 36.9  Platelets 150 - 400 K/uL 450(H) 267 281    CMP Latest Ref Rng & Units 08/17/2020 11/02/2015 05/03/2015  Glucose 70 - 99 mg/dL 101(H) 104(H) -  BUN 6 - 20 mg/dL 12 15 -  Creatinine 0.44 - 1.00 mg/dL 0.79 0.65 0.70  Sodium 135 - 145 mmol/L 139 136 -  Potassium 3.5 - 5.1 mmol/L 4.1 3.8 -  Chloride 98 - 111 mmol/L 106 105 -  CO2 22 - 32 mmol/L 23 25 -  Calcium 8.9 - 10.3 mg/dL 8.4(L) 8.5(L) -  Total Protein 6.5 - 8.1 g/dL 7.5 - -  Total Bilirubin 0.3 - 1.2 mg/dL 0.3 - -  Alkaline Phos 38 - 126 U/L 60 - -  AST 15 - 41 U/L 11(L) - -  ALT 0 - 44 U/L 6 - -    RADIOGRAPHIC STUDIES: No results found.  ASSESSMENT & PLAN Dominique Jones 35 y.o. female with medical history significant for recently diagnosed lupus, ectopic  pregnancy who presents for evaluation of iron deficiency anemia.   After review of the labs, review of the records, and discussion with the patient the patients findings are most consistent with multifactorial iron deficiency anemia.  This is likely secondary to the patient's heavy menstrual cycles and history of lupus.  As such I would recommend we proceed with IV Feraheme 500 mg q. 7 days x 2 doses.  Additionally of the patient's hemoglobin were to be found to be less than 7 I would recommend we proceed with blood transfusion.  The patient voiced understanding of this plan moving forward.  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 --Given the severity of her deficit would recommend proceeding with IV Feraheme 510 mg q. 7 days x 2 doses if this is not approved we will proceed with iron sucrose --We will order new baseline lab today to include hemoglobin, reticulocyte panel, iron panel, and ferritin --RTC for IV iron with f/u 4 weeks after last dose  Orders Placed This Encounter  Procedures   CBC with Differential ([facility] Only)    Standing Status:   Future    Number of Occurrences:   1    Standing Expiration Date:   08/17/2021   CMP ([facility] only)    Standing Status:   Future    Number of Occurrences:   1    Standing Expiration Date:   08/17/2021   Lactate dehydrogenase (LDH)    Standing Status:   Future    Number of Occurrences:   1    Standing Expiration Date:   08/17/2021   Ferritin    Standing Status:   Future    Number of Occurrences:   1    Standing Expiration Date:   08/17/2021   Retic Panel    Standing Status:   Future    Number of Occurrences:   1  Standing Expiration Date:   08/17/2021   Iron and TIBC    Standing Status:   Future    Number of Occurrences:   1    Standing Expiration Date:   08/17/2021    Haptoglobin    Standing Status:   Future    Number of Occurrences:   1    Standing Expiration Date:   08/17/2021   BLOOD TRANSFUSION REPORT - SCANNED   BLOOD TRANSFUSION REPORT - SCANNED    All questions were answered. The patient knows to call the clinic with any problems, questions or concerns.  A total of more than 60 minutes were spent on this encounter with face-to-face time and non-face-to-face time, including preparing to see the patient, ordering tests and/or medications, counseling the patient and coordination of care as outlined above.   Ledell Peoples, MD Department of Hematology/Oncology [place] [facility] Phone: [phone] Pager: [phone] Email: [email or web address]  08/24/2020 7:12 PM

## 2020-08-17 NOTE — Progress Notes (Signed)
CRITICAL VALUE STICKER  CRITICAL VALUE: HGB 6.8  RECEIVER (on-site recipient of call): Denny Peon, Rn; Drucie Ip, RN  [place] NOTIFIED: 08/17/20 1500  MESSENGER (representative from lab):  MD NOTIFIED: Dr. Lorenso Courier  TIME OF NOTIFICATION: 1502  RESPONSE:  Pt to get type and cross, ABO done this afternoon. Schedule for transfusion of 2 units of PRBC's  Call made to patient and she will come in this afternoon for T&C. She is aware that we will be scheduling her for transfusion in the next 2 days.

## 2020-08-18 ENCOUNTER — Other Ambulatory Visit: Payer: Self-pay

## 2020-08-18 ENCOUNTER — Telehealth: Payer: Self-pay | Admitting: Hematology and Oncology

## 2020-08-18 DIAGNOSIS — D500 Iron deficiency anemia secondary to blood loss (chronic): Secondary | ICD-10-CM

## 2020-08-18 LAB — IRON AND TIBC
Iron: 13 ug/dL — ABNORMAL LOW (ref 41–142)
Saturation Ratios: 3 % — ABNORMAL LOW (ref 21–57)
TIBC: 435 ug/dL (ref 236–444)
UIBC: 422 ug/dL — ABNORMAL HIGH (ref 120–384)

## 2020-08-18 LAB — PREPARE RBC (CROSSMATCH)

## 2020-08-18 LAB — FERRITIN: Ferritin: <4 ng/mL — ABNORMAL LOW (ref 11–307)

## 2020-08-18 LAB — HAPTOGLOBIN: Haptoglobin: 173 mg/dL (ref 33–278)

## 2020-08-18 NOTE — Telephone Encounter (Signed)
Scheduled appt per 6/27 sch msg. Pt aware.  

## 2020-08-18 NOTE — Progress Notes (Signed)
Releasing prepare

## 2020-08-18 NOTE — Progress Notes (Signed)
Releasing type and screen

## 2020-08-19 ENCOUNTER — Other Ambulatory Visit: Payer: Self-pay | Admitting: Hematology and Oncology

## 2020-08-19 ENCOUNTER — Other Ambulatory Visit: Payer: Self-pay

## 2020-08-19 ENCOUNTER — Inpatient Hospital Stay: Payer: BC Managed Care – PPO

## 2020-08-19 DIAGNOSIS — M329 Systemic lupus erythematosus, unspecified: Secondary | ICD-10-CM | POA: Diagnosis not present

## 2020-08-19 DIAGNOSIS — N920 Excessive and frequent menstruation with regular cycle: Secondary | ICD-10-CM | POA: Diagnosis not present

## 2020-08-19 DIAGNOSIS — D500 Iron deficiency anemia secondary to blood loss (chronic): Secondary | ICD-10-CM

## 2020-08-19 DIAGNOSIS — R0602 Shortness of breath: Secondary | ICD-10-CM | POA: Diagnosis not present

## 2020-08-19 MED ORDER — ACETAMINOPHEN 325 MG PO TABS
650.0000 mg | ORAL_TABLET | Freq: Once | ORAL | Status: AC
Start: 2020-08-19 — End: 2020-08-19
  Administered 2020-08-19: 650 mg via ORAL
  Filled 2020-08-19: qty 2

## 2020-08-19 MED ORDER — SODIUM CHLORIDE 0.9% IV SOLUTION
250.0000 mL | Freq: Once | INTRAVENOUS | Status: AC
  Administered 2020-08-19: 250 mL via INTRAVENOUS
  Filled 2020-08-19: qty 250

## 2020-08-19 NOTE — Patient Instructions (Signed)
Blood Transfusion, Adult, Care After This sheet gives you information about how to care for yourself after your procedure. Your doctor may also give you more specific instructions. If youhave problems or questions, contact your doctor. What can I expect after the procedure? After the procedure, it is common to have: Bruising and soreness at the IV site. A fever or chills on the day of the procedure. This may be your body's response to the new blood cells received. A headache. Follow these instructions at home: Insertion site care     Follow instructions from your doctor about how to take care of your insertion site. This is where an IV tube was put into your vein. Make sure you: Wash your hands with soap and water before and after you change your bandage (dressing). If you cannot use soap and water, use hand sanitizer. Change your bandage as told by your doctor. Check your insertion site every day for signs of infection. Check for: Redness, swelling, or pain. Bleeding from the site. Warmth. Pus or a bad smell. General instructions Take over-the-counter and prescription medicines only as told by your doctor. Rest as told by your doctor. Go back to your normal activities as told by your doctor. Keep all follow-up visits as told by your doctor. This is important. Contact a doctor if: You have itching or red, swollen areas of skin (hives). You feel worried or nervous (anxious). You feel weak after doing your normal activities. You have redness, swelling, warmth, or pain around the insertion site. You have blood coming from the insertion site, and the blood does not stop with pressure. You have pus or a bad smell coming from the insertion site. Get help right away if: You have signs of a serious reaction. This may be coming from an allergy or the body's defense system (immune system). Signs include: Trouble breathing or shortness of breath. Swelling of the face or feeling warm  (flushed). Fever or chills. Head, chest, or back pain. Dark pee (urine) or blood in the pee. Widespread rash. Fast heartbeat. Feeling dizzy or light-headed. You may receive your blood transfusion in an outpatient setting. If so, youwill be told whom to contact to report any reactions. These symptoms may be an emergency. Do not wait to see if the symptoms will go away. Get medical help right away. Call your local emergency services (911 in the [place] Do not drive yourself to the hospital. Summary Bruising and soreness at the IV site are common. Check your insertion site every day for signs of infection. Rest as told by your doctor. Go back to your normal activities as told by your doctor. Get help right away if you have signs of a serious reaction. This information is not intended to replace advice given to you by your health care provider. Make sure you discuss any questions you have with your healthcare provider. Document Revised: 08/02/2018 Document Reviewed: 08/02/2018 Elsevier Patient Education  2022 [employer] Inc[employer]  

## 2020-08-20 LAB — BPAM RBC
Blood Product Expiration Date: 202207212359
Blood Product Expiration Date: 202207212359
ISSUE DATE / TIME: 202206290746
ISSUE DATE / TIME: 202206290746
Unit Type and Rh: 6200
Unit Type and Rh: 6200

## 2020-08-20 LAB — TYPE AND SCREEN
ABO/RH(D): A POS
Antibody Screen: NEGATIVE
Unit division: 0
Unit division: 0

## 2020-08-24 ENCOUNTER — Encounter: Payer: Self-pay | Admitting: Hematology and Oncology

## 2020-08-24 ENCOUNTER — Other Ambulatory Visit (HOSPITAL_BASED_OUTPATIENT_CLINIC_OR_DEPARTMENT_OTHER): Payer: Self-pay | Admitting: Hematology and Oncology

## 2020-08-24 DIAGNOSIS — D500 Iron deficiency anemia secondary to blood loss (chronic): Secondary | ICD-10-CM | POA: Insufficient documentation

## 2020-08-25 ENCOUNTER — Telehealth: Payer: Self-pay | Admitting: Hematology and Oncology

## 2020-08-25 NOTE — Telephone Encounter (Signed)
Scheduled appts per 7/4 sch msg. Pt aware.

## 2020-08-27 ENCOUNTER — Other Ambulatory Visit: Payer: Self-pay | Admitting: Hematology and Oncology

## 2020-08-28 ENCOUNTER — Telehealth: Payer: Self-pay | Admitting: Hematology and Oncology

## 2020-08-28 NOTE — Telephone Encounter (Signed)
Scheduled appts per 7/7 sch msg. Called pt, no answer. Left msg with appts dates and times.  

## 2020-08-31 ENCOUNTER — Other Ambulatory Visit: Payer: Self-pay

## 2020-08-31 ENCOUNTER — Inpatient Hospital Stay: Payer: BC Managed Care – PPO | Attending: Hematology and Oncology

## 2020-08-31 VITALS — BP 110/60 | HR 83 | Temp 99.0°F | Resp 16

## 2020-08-31 DIAGNOSIS — N920 Excessive and frequent menstruation with regular cycle: Secondary | ICD-10-CM | POA: Diagnosis not present

## 2020-08-31 DIAGNOSIS — D500 Iron deficiency anemia secondary to blood loss (chronic): Secondary | ICD-10-CM | POA: Diagnosis not present

## 2020-08-31 MED ORDER — SODIUM CHLORIDE 0.9 % IV SOLN
Freq: Once | INTRAVENOUS | Status: AC
  Filled 2020-08-31: qty 250

## 2020-08-31 MED ORDER — SODIUM CHLORIDE 0.9 % IV SOLN
200.0000 mg | Freq: Once | INTRAVENOUS | Status: AC
  Administered 2020-08-31: 200 mg via INTRAVENOUS
  Filled 2020-08-31: qty 200

## 2020-08-31 NOTE — Patient Instructions (Signed)

## 2020-09-07 ENCOUNTER — Other Ambulatory Visit: Payer: Self-pay | Admitting: *Deleted

## 2020-09-07 ENCOUNTER — Inpatient Hospital Stay: Payer: BC Managed Care – PPO

## 2020-09-07 ENCOUNTER — Other Ambulatory Visit: Payer: Self-pay

## 2020-09-07 VITALS — BP 131/86 | HR 74 | Temp 98.6°F

## 2020-09-07 DIAGNOSIS — N920 Excessive and frequent menstruation with regular cycle: Secondary | ICD-10-CM | POA: Diagnosis not present

## 2020-09-07 DIAGNOSIS — D500 Iron deficiency anemia secondary to blood loss (chronic): Secondary | ICD-10-CM

## 2020-09-07 LAB — CBC WITH DIFFERENTIAL (CANCER CENTER ONLY)
Abs Immature Granulocytes: 0.02 10*3/uL (ref 0.00–0.07)
Basophils Absolute: 0.1 10*3/uL (ref 0.0–0.1)
Basophils Relative: 2 %
Eosinophils Absolute: 0.4 10*3/uL (ref 0.0–0.5)
Eosinophils Relative: 6 %
HCT: 30.9 % — ABNORMAL LOW (ref 36.0–46.0)
Hemoglobin: 9.3 g/dL — ABNORMAL LOW (ref 12.0–15.0)
Immature Granulocytes: 0 %
Lymphocytes Relative: 33 %
Lymphs Abs: 2.1 10*3/uL (ref 0.7–4.0)
MCH: 21.2 pg — ABNORMAL LOW (ref 26.0–34.0)
MCHC: 30.1 g/dL (ref 30.0–36.0)
MCV: 70.5 fL — ABNORMAL LOW (ref 80.0–100.0)
Monocytes Absolute: 0.5 10*3/uL (ref 0.1–1.0)
Monocytes Relative: 8 %
Neutro Abs: 3.3 10*3/uL (ref 1.7–7.7)
Neutrophils Relative %: 51 %
Platelet Count: 536 10*3/uL — ABNORMAL HIGH (ref 150–400)
RBC: 4.38 MIL/uL (ref 3.87–5.11)
RDW: 27.3 % — ABNORMAL HIGH (ref 11.5–15.5)
WBC Count: 6.5 10*3/uL (ref 4.0–10.5)
nRBC: 0 % (ref 0.0–0.2)

## 2020-09-07 LAB — SAMPLE TO BLOOD BANK

## 2020-09-07 MED ORDER — SODIUM CHLORIDE 0.9 % IV SOLN
200.0000 mg | Freq: Once | INTRAVENOUS | Status: AC
  Administered 2020-09-07: 200 mg via INTRAVENOUS
  Filled 2020-09-07: qty 200

## 2020-09-07 MED ORDER — SODIUM CHLORIDE 0.9 % IV SOLN
Freq: Once | INTRAVENOUS | Status: AC
  Filled 2020-09-07: qty 250

## 2020-09-07 NOTE — Patient Instructions (Signed)

## 2020-09-14 ENCOUNTER — Other Ambulatory Visit: Payer: Self-pay

## 2020-09-14 ENCOUNTER — Inpatient Hospital Stay: Payer: BC Managed Care – PPO

## 2020-09-14 VITALS — BP 125/88 | HR 77 | Temp 98.3°F | Resp 17

## 2020-09-14 DIAGNOSIS — N920 Excessive and frequent menstruation with regular cycle: Secondary | ICD-10-CM | POA: Diagnosis not present

## 2020-09-14 DIAGNOSIS — D500 Iron deficiency anemia secondary to blood loss (chronic): Secondary | ICD-10-CM

## 2020-09-14 MED ORDER — SODIUM CHLORIDE 0.9 % IV SOLN
Freq: Once | INTRAVENOUS | Status: AC
  Filled 2020-09-14: qty 250

## 2020-09-14 MED ORDER — SODIUM CHLORIDE 0.9 % IV SOLN
200.0000 mg | Freq: Once | INTRAVENOUS | Status: AC
  Administered 2020-09-14: 200 mg via INTRAVENOUS
  Filled 2020-09-14: qty 200

## 2020-09-14 NOTE — Patient Instructions (Signed)

## 2020-09-14 NOTE — Progress Notes (Signed)
Patient observed for 30 minutes post Venofer infusion. Tolerated treatment well. VSS. Patient ambulatory to lobby.

## 2020-09-21 ENCOUNTER — Other Ambulatory Visit: Payer: BC Managed Care – PPO

## 2020-09-21 ENCOUNTER — Other Ambulatory Visit: Payer: Self-pay

## 2020-09-21 ENCOUNTER — Inpatient Hospital Stay: Payer: BC Managed Care – PPO | Attending: Hematology and Oncology

## 2020-09-21 ENCOUNTER — Ambulatory Visit: Payer: BC Managed Care – PPO | Admitting: Hematology and Oncology

## 2020-09-21 VITALS — BP 130/91 | HR 71 | Temp 98.7°F | Resp 16

## 2020-09-21 DIAGNOSIS — N920 Excessive and frequent menstruation with regular cycle: Secondary | ICD-10-CM | POA: Diagnosis not present

## 2020-09-21 DIAGNOSIS — M329 Systemic lupus erythematosus, unspecified: Secondary | ICD-10-CM | POA: Insufficient documentation

## 2020-09-21 DIAGNOSIS — D500 Iron deficiency anemia secondary to blood loss (chronic): Secondary | ICD-10-CM | POA: Insufficient documentation

## 2020-09-21 MED ORDER — SODIUM CHLORIDE 0.9 % IV SOLN
200.0000 mg | Freq: Once | INTRAVENOUS | Status: AC
  Administered 2020-09-21: 200 mg via INTRAVENOUS
  Filled 2020-09-21: qty 200

## 2020-09-21 MED ORDER — SODIUM CHLORIDE 0.9 % IV SOLN
Freq: Once | INTRAVENOUS | Status: AC
  Filled 2020-09-21: qty 250

## 2020-09-21 NOTE — Patient Instructions (Signed)

## 2020-09-28 ENCOUNTER — Inpatient Hospital Stay: Payer: BC Managed Care – PPO

## 2020-09-28 ENCOUNTER — Other Ambulatory Visit: Payer: Self-pay

## 2020-09-28 VITALS — BP 137/84 | HR 81 | Temp 98.7°F | Resp 17

## 2020-09-28 DIAGNOSIS — D500 Iron deficiency anemia secondary to blood loss (chronic): Secondary | ICD-10-CM

## 2020-09-28 DIAGNOSIS — N920 Excessive and frequent menstruation with regular cycle: Secondary | ICD-10-CM | POA: Diagnosis not present

## 2020-09-28 DIAGNOSIS — M329 Systemic lupus erythematosus, unspecified: Secondary | ICD-10-CM | POA: Diagnosis not present

## 2020-09-28 MED ORDER — SODIUM CHLORIDE 0.9 % IV SOLN
Freq: Once | INTRAVENOUS | Status: AC
  Filled 2020-09-28: qty 250

## 2020-09-28 MED ORDER — SODIUM CHLORIDE 0.9 % IV SOLN
200.0000 mg | Freq: Once | INTRAVENOUS | Status: AC
  Administered 2020-09-28: 200 mg via INTRAVENOUS
  Filled 2020-09-28: qty 200

## 2020-09-28 NOTE — Patient Instructions (Signed)

## 2020-10-19 ENCOUNTER — Other Ambulatory Visit: Payer: Self-pay | Admitting: Hematology and Oncology

## 2020-10-19 ENCOUNTER — Inpatient Hospital Stay: Payer: BC Managed Care – PPO

## 2020-10-19 ENCOUNTER — Other Ambulatory Visit: Payer: Self-pay

## 2020-10-19 ENCOUNTER — Inpatient Hospital Stay (HOSPITAL_BASED_OUTPATIENT_CLINIC_OR_DEPARTMENT_OTHER): Payer: BC Managed Care – PPO | Admitting: Hematology and Oncology

## 2020-10-19 VITALS — BP 123/96 | HR 103 | Temp 97.1°F | Resp 18 | Wt 200.3 lb

## 2020-10-19 DIAGNOSIS — M329 Systemic lupus erythematosus, unspecified: Secondary | ICD-10-CM | POA: Diagnosis not present

## 2020-10-19 DIAGNOSIS — D500 Iron deficiency anemia secondary to blood loss (chronic): Secondary | ICD-10-CM

## 2020-10-19 DIAGNOSIS — N920 Excessive and frequent menstruation with regular cycle: Secondary | ICD-10-CM | POA: Diagnosis not present

## 2020-10-19 LAB — CBC WITH DIFFERENTIAL (CANCER CENTER ONLY)
Abs Immature Granulocytes: 0.01 10*3/uL (ref 0.00–0.07)
Basophils Absolute: 0.1 10*3/uL (ref 0.0–0.1)
Basophils Relative: 1 %
Eosinophils Absolute: 0.3 10*3/uL (ref 0.0–0.5)
Eosinophils Relative: 7 %
HCT: 30.1 % — ABNORMAL LOW (ref 36.0–46.0)
Hemoglobin: 9.4 g/dL — ABNORMAL LOW (ref 12.0–15.0)
Immature Granulocytes: 0 %
Lymphocytes Relative: 36 %
Lymphs Abs: 1.8 10*3/uL (ref 0.7–4.0)
MCH: 24.9 pg — ABNORMAL LOW (ref 26.0–34.0)
MCHC: 31.2 g/dL (ref 30.0–36.0)
MCV: 79.8 fL — ABNORMAL LOW (ref 80.0–100.0)
Monocytes Absolute: 0.4 10*3/uL (ref 0.1–1.0)
Monocytes Relative: 8 %
Neutro Abs: 2.3 10*3/uL (ref 1.7–7.7)
Neutrophils Relative %: 48 %
Platelet Count: 313 10*3/uL (ref 150–400)
RBC: 3.77 MIL/uL — ABNORMAL LOW (ref 3.87–5.11)
RDW: 25.1 % — ABNORMAL HIGH (ref 11.5–15.5)
WBC Count: 4.9 10*3/uL (ref 4.0–10.5)
nRBC: 0 % (ref 0.0–0.2)

## 2020-10-19 LAB — CMP (CANCER CENTER ONLY)
ALT: 7 U/L (ref 0–44)
AST: 11 U/L — ABNORMAL LOW (ref 15–41)
Albumin: 3.9 g/dL (ref 3.5–5.0)
Alkaline Phosphatase: 52 U/L (ref 38–126)
Anion gap: 7 (ref 5–15)
BUN: 14 mg/dL (ref 6–20)
CO2: 25 mmol/L (ref 22–32)
Calcium: 8.6 mg/dL — ABNORMAL LOW (ref 8.9–10.3)
Chloride: 108 mmol/L (ref 98–111)
Creatinine: 0.86 mg/dL (ref 0.44–1.00)
GFR, Estimated: >60 mL/min (ref >60)
Glucose, Bld: 108 mg/dL — ABNORMAL HIGH (ref 70–99)
Potassium: 4.1 mmol/L (ref 3.5–5.1)
Sodium: 140 mmol/L (ref 135–145)
Total Bilirubin: <0.2 mg/dL — ABNORMAL LOW (ref 0.3–1.2)
Total Protein: 6.9 g/dL (ref 6.5–8.1)

## 2020-10-19 LAB — RETIC PANEL
Immature Retic Fract: 25.1 % — ABNORMAL HIGH (ref 2.3–15.9)
RBC.: 3.73 MIL/uL — ABNORMAL LOW (ref 3.87–5.11)
Retic Count, Absolute: 46.6 10*3/uL (ref 19.0–186.0)
Retic Ct Pct: 1.3 % (ref 0.4–3.1)
Reticulocyte Hemoglobin: 28.8 pg (ref >27.9)

## 2020-10-19 NOTE — Progress Notes (Signed)
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 ESR 30, CRP 3. Vitamin B12 628, folate 9.0. 08/17/2020: establish care with Dr. Lorenso Courier 7/11-09/28/2020: IV iron sucrose 254m x 5 doses 10/19/2020: Hgb 9.4, MCV 79.8, Plt 313  Interval History:  Dominique Jones with medical history significant for iron deficiency anemia in setting of lupus who presents for a follow up visit. The patient's last visit was on 08/17/2020. In the interim since the last visit she has received 5 doses of IV iron therapy.   On exam today [name] that she tolerated the IV iron therapy well.  She has that she did not have any reactions or side effects as result of the transfusions.  She notes that there have been marked improvement in her symptoms with a decline in her fatigue.  She notes that her energy went from 0 out of 10 up to 7 out of 10.  She has she is not having any shortness of breath and her iron cravings have dissipated.  She is still having some symptoms regarding restless legs.  On further discussion she notes that her menstrual cycles have been heavy.  She has that they are still relatively heavy with passage of clots but the bleeding seems lessened.  She has that she has not yet had a follow-up for her lupus.  She also follows with OB/GYN but has not started any medications in order to better control her menstrual cycles.  She otherwise denies any fevers, chills, sweats, nausea, vomiting or diarrhea.  Full 10 point ROS is listed below.  MEDICAL HISTORY:  Past Medical History:  Diagnosis Date   Complication of anesthesia    difficult intubation   Ectopic pregnancy    Glaucoma    follows up with opthalmologist once a year for  pressure check    SURGICAL HISTORY: Past Surgical History:  Procedure Laterality Date   LAPAROSCOPY N/A 12/26/2012   Procedure: LAPAROSCOPY OPERATIVE REMOVAL OF ECTOPIC PREGNANCY;  Surgeon: TGovernor Specking MD;  [facility];  Service: Gynecology;  Laterality: N/A;  Attempted   LAPAROTOMY N/A 12/26/2012   Procedure: LAPAROTOMY;  Surgeon: TGovernor Specking MD;  [facility];  Service: Gynecology;  Laterality: N/A;   MYOMECTOMY N/A 11/10/2015   Procedure: MYOMECTOMY, Left Salpingectomy, Excision of Incisional Mass;  Surgeon: VEldred Manges MD;  [facility];  Service: Gynecology;  Laterality: N/A;   OPERATIVE ULTRASOUND Right 12/14/2012   Procedure: OPERATIVE ULTRASOUND GUIDED INJECTION OF POTASSIUM CHLORIDE 2.5ML;  Surgeon: TGovernor Specking MD;  [facility]  Service: Gynecology;  Laterality: Right;   REFRACTIVE SURGERY     UNILATERAL SALPINGECTOMY Right 12/26/2012   Procedure: UNILATERAL SALPINGECTOMY;  Surgeon: TGovernor Specking MD;  [facility];  Service: Gynecology;  Laterality: Right;    SOCIAL HISTORY: Social History   Socioeconomic History   Marital status: Single    Spouse name: Not on file   Number of children: Not on file   Years of education: Not on file   Highest education level: Not on file  Occupational History   Not on file  Tobacco Use   Smoking status: Never   Smokeless tobacco: Never  Substance and Sexual Activity   Alcohol use: Yes  Comment: socially   Drug use: No   Sexual activity: Yes    Birth control/protection: None  Other Topics Concern   Not on file  Social History Narrative   Not on file   Social Determinants of Health   Financial Resource Strain: Not on file  Food Insecurity: Not on file  Transportation Needs: Not on file  Physical Activity: Not on file  Stress: Not on file  Social Connections: Not on file  Intimate Partner Violence: Not on file    FAMILY HISTORY: No family history on  file.  ALLERGIES:  has No Known Allergies.  MEDICATIONS:  Current Outpatient Medications  Medication Sig Dispense Refill   acetaminophen (TYLENOL) 500 MG tablet Take 1,000 mg by mouth every 6 (six) hours as needed for moderate pain.     diphenhydrAMINE (BENADRYL) 25 MG tablet Take 25 mg by mouth every 6 (six) hours as needed for allergies.     hydroxychloroquine (PLAQUENIL) 200 MG tablet hydroxychloroquine 200 mg tablet     ibuprofen (ADVIL,MOTRIN) 600 MG tablet 1  po  pc every 6 hours for 5 days then prn-pain (Patient taking differently: 1  po  pc every 6 hours for 5 days then prn-pain) 30 tablet 1   Vitamin D, Ergocalciferol, (DRISDOL) 1.25 MG (50000 UNIT) CAPS capsule Take 50,000 Units by mouth once a week.     No current facility-administered medications for this visit.    REVIEW OF SYSTEMS:   Constitutional: ( - ) fevers, ( - )  chills , ( - ) night sweats Eyes: ( - ) blurriness of vision, ( - ) double vision, ( - ) watery eyes Ears, nose, mouth, throat, and face: ( - ) mucositis, ( - ) sore throat Respiratory: ( - ) cough, ( - ) dyspnea, ( - ) wheezes Cardiovascular: ( - ) palpitation, ( - ) chest discomfort, ( - ) lower extremity swelling Gastrointestinal:  ( - ) nausea, ( - ) heartburn, ( - ) change in bowel habits Skin: ( - ) abnormal skin rashes Lymphatics: ( - ) new lymphadenopathy, ( - ) easy bruising Neurological: ( - ) numbness, ( - ) tingling, ( - ) new weaknesses Behavioral/Psych: ( - ) mood change, ( - ) new changes  All other systems were reviewed with the patient and are negative.  PHYSICAL EXAMINATION: ECOG PERFORMANCE STATUS: 1 - Symptomatic but completely ambulatory  Vitals:   10/19/20 1516  BP: (!) 123/96  Pulse: (!) 103  Resp: 18  Temp: (!) 97.1 F (36.2 C)  SpO2: 100%   Filed Weights   10/19/20 1516  Weight: 200 lb 4.8 oz (90.9 kg)    GENERAL: Well-appearing middle-aged African-American Jones, alert, no distress and comfortable SKIN: skin  color, texture, turgor are normal, no rashes or significant lesions EYES: conjunctiva are pink and non-injected, sclera clear LUNGS: clear to auscultation and percussion with normal breathing effort HEART: regular rate & rhythm and no murmurs and no lower extremity edema Musculoskeletal: no cyanosis of digits and no clubbing  PSYCH: alert & oriented x 3, fluent speech NEURO: no focal motor/sensory deficits  LABORATORY DATA:  I have reviewed the data as listed CBC Latest Ref Rng & Units 10/19/2020 09/07/2020 08/17/2020  WBC 4.0 - 10.5 K/uL 4.9 6.5 6.8  Hemoglobin 12.0 - 15.0 g/dL 9.4(L) 9.3(L) 6.8(LL)  Hematocrit 36.0 - 46.0 % 30.1(L) 30.9(L) 24.3(L)  Platelets 150 - 400 K/uL 313 536(H) 450(H)    CMP Latest Ref Rng & Units 10/19/2020  08/17/2020 11/02/2015  Glucose 70 - 99 mg/dL 108(H) 101(H) 104(H)  BUN 6 - 20 mg/dL _0 Creatinine 0.44 - 1.00 mg/dL 0.86 0.79 0.65  Sodium 135 - 145 mmol/L 140 139 136  Potassium 3.5 - 5.1 mmol/L 4.1 4.1 3.8  Chloride 98 - 111 mmol/L 108 106 105  CO2 22 - 32 mmol/L _1 Calcium 8.9 - 10.3 mg/dL 8.6(L) 8.4(L) 8.5(L)  Total Protein 6.5 - 8.1 g/dL 6.9 7.5 -  Total Bilirubin 0.3 - 1.2 mg/dL <0.2(L) 0.3 -  Alkaline Phos 38 - 126 U/L 52 60 -  AST 15 - 41 U/L 11(L) 11(L) -  ALT 0 - 44 U/L 7 6 -    RADIOGRAPHIC STUDIES: I have personally reviewed the radiological images as listed and agreed with the findings in the report. No results found.  ASSESSMENT & PLAN Dominique Jones 35 y.o. Jones with medical history significant for iron deficiency anemia in setting of lupus who presents for a follow up visit.   After review of the labs, review of the records, and discussion with the patient the patients findings are most consistent with multifactorial iron deficiency anemia.  This is likely secondary to the patient's heavy menstrual cycles and history of lupus.  We proceed with IV iron sucrose 223m x 5 doses with a moderate response in the Hgb.  Iron labs  from today are still pending.  Patient is found to have continued iron deficiency we will set her up for additional rounds of IV iron recommend continued monitoring and is likely that her hemoglobin is suppressed by her chronic inflammatory condition.  The patient voiced understanding of this plan moving forward.   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 --received 5 doss of 2071mIV iron sucrose from 7/11-09/28/2020 --modest bump in Hgb to 9.4 today.  -- labs today to include hemoglobin, reticulocyte panel, iron panel, and ferritin --RTC in 3 months of sooner if more IV iron is required.   No orders of the defined types were placed in this encounter.   All questions were answered. The patient knows to call the clinic with any problems, questions or concerns.  A total of more than 30 minutes were spent on this encounter with face-to-face time and non-face-to-face time, including preparing to see the patient, ordering tests and/or medications, counseling the patient and coordination of care as outlined above.   JoLedell PeoplesMD Department of Hematology/Oncology [facility] Countyhone: [phone]: [phone]: [email or web address]  10/19/2020 4:34 PM

## 2020-10-20 LAB — IRON AND TIBC
Iron: 30 ug/dL — ABNORMAL LOW (ref 41–142)
Saturation Ratios: 9 % — ABNORMAL LOW (ref 21–57)
TIBC: 314 ug/dL (ref 236–444)
UIBC: 284 ug/dL (ref 120–384)

## 2020-10-20 LAB — FERRITIN: Ferritin: 31 ng/mL (ref 11–307)

## 2020-12-01 DIAGNOSIS — Z6831 Body mass index (BMI) 31.0-31.9, adult: Secondary | ICD-10-CM | POA: Diagnosis not present

## 2020-12-01 DIAGNOSIS — Z01411 Encounter for gynecological examination (general) (routine) with abnormal findings: Secondary | ICD-10-CM | POA: Diagnosis not present

## 2020-12-01 DIAGNOSIS — D259 Leiomyoma of uterus, unspecified: Secondary | ICD-10-CM | POA: Diagnosis not present

## 2020-12-01 DIAGNOSIS — Z124 Encounter for screening for malignant neoplasm of cervix: Secondary | ICD-10-CM | POA: Diagnosis not present

## 2020-12-01 DIAGNOSIS — N920 Excessive and frequent menstruation with regular cycle: Secondary | ICD-10-CM | POA: Diagnosis not present

## 2020-12-22 ENCOUNTER — Emergency Department (HOSPITAL_COMMUNITY)
Admission: EM | Admit: 2020-12-22 | Discharge: 2020-12-23 | Disposition: A | Discharge status: Home / Self Care | Payer: BC Managed Care – PPO | Attending: Emergency Medicine | Admitting: Emergency Medicine

## 2020-12-22 ENCOUNTER — Encounter (HOSPITAL_COMMUNITY): Payer: Self-pay | Admitting: Emergency Medicine

## 2020-12-22 ENCOUNTER — Emergency Department (HOSPITAL_COMMUNITY): Payer: BC Managed Care – PPO

## 2020-12-22 ENCOUNTER — Other Ambulatory Visit: Payer: Self-pay

## 2020-12-22 DIAGNOSIS — R140 Abdominal distension (gaseous): Secondary | ICD-10-CM | POA: Insufficient documentation

## 2020-12-22 DIAGNOSIS — R1030 Lower abdominal pain, unspecified: Secondary | ICD-10-CM | POA: Insufficient documentation

## 2020-12-22 DIAGNOSIS — N939 Abnormal uterine and vaginal bleeding, unspecified: Secondary | ICD-10-CM

## 2020-12-22 DIAGNOSIS — N938 Other specified abnormal uterine and vaginal bleeding: Secondary | ICD-10-CM | POA: Diagnosis not present

## 2020-12-22 DIAGNOSIS — D259 Leiomyoma of uterus, unspecified: Secondary | ICD-10-CM | POA: Diagnosis not present

## 2020-12-22 DIAGNOSIS — N9489 Other specified conditions associated with female genital organs and menstrual cycle: Secondary | ICD-10-CM | POA: Diagnosis not present

## 2020-12-22 DIAGNOSIS — Z5321 Procedure and treatment not carried out due to patient leaving prior to being seen by health care provider: Secondary | ICD-10-CM | POA: Insufficient documentation

## 2020-12-22 DIAGNOSIS — R9431 Abnormal electrocardiogram [ECG] [EKG]: Secondary | ICD-10-CM | POA: Diagnosis not present

## 2020-12-22 LAB — CBC WITH DIFFERENTIAL/PLATELET
Abs Immature Granulocytes: 0.04 10*3/uL (ref 0.00–0.07)
Basophils Absolute: 0.1 10*3/uL (ref 0.0–0.1)
Basophils Relative: 1 %
Eosinophils Absolute: 0.2 10*3/uL (ref 0.0–0.5)
Eosinophils Relative: 2 %
HCT: 26.8 % — ABNORMAL LOW (ref 36.0–46.0)
Hemoglobin: 8.1 g/dL — ABNORMAL LOW (ref 12.0–15.0)
Immature Granulocytes: 1 %
Lymphocytes Relative: 22 %
Lymphs Abs: 1.9 10*3/uL (ref 0.7–4.0)
MCH: 21.7 pg — ABNORMAL LOW (ref 26.0–34.0)
MCHC: 30.2 g/dL (ref 30.0–36.0)
MCV: 71.7 fL — ABNORMAL LOW (ref 80.0–100.0)
Monocytes Absolute: 0.5 10*3/uL (ref 0.1–1.0)
Monocytes Relative: 6 %
Neutro Abs: 5.7 10*3/uL (ref 1.7–7.7)
Neutrophils Relative %: 68 %
Platelets: 439 10*3/uL — ABNORMAL HIGH (ref 150–400)
RBC: 3.74 MIL/uL — ABNORMAL LOW (ref 3.87–5.11)
RDW: 17.4 % — ABNORMAL HIGH (ref 11.5–15.5)
WBC: 8.5 10*3/uL (ref 4.0–10.5)
nRBC: 0 % (ref 0.0–0.2)

## 2020-12-22 LAB — BASIC METABOLIC PANEL
Anion gap: 7 (ref 5–15)
BUN: 11 mg/dL (ref 6–20)
CO2: 23 mmol/L (ref 22–32)
Calcium: 8.5 mg/dL — ABNORMAL LOW (ref 8.9–10.3)
Chloride: 107 mmol/L (ref 98–111)
Creatinine, Ser: 0.72 mg/dL (ref 0.44–1.00)
GFR, Estimated: >60 mL/min (ref >60)
Glucose, Bld: 90 mg/dL (ref 70–99)
Potassium: 3.6 mmol/L (ref 3.5–5.1)
Sodium: 137 mmol/L (ref 135–145)

## 2020-12-22 LAB — I-STAT BETA HCG BLOOD, ED (MC, WL, AP ONLY): I-stat hCG, quantitative: <5 m[IU]/mL

## 2020-12-22 NOTE — ED Provider Notes (Signed)
Emergency Medicine Provider Triage Evaluation Note  Dominique Jones , a 35 y.o. female  was evaluated in triage.  Pt complains of heavy vaginal bleeding over the last 3 weeks.  Has a history of fibroids with subsequent myomectomy.  Was seen by her OB/GYN and placed on medication to help with pain which is offered no improvement.  Reports associated abdominal distention, shortness of breath, and dizziness but denies fever, chills, and diarrhea.  No urinary complaints.  Review of Systems  Positive:  Negative: See above   Physical Exam  BP (!) 172/102 (BP Location: Right Arm)   Pulse 86   Temp 99.1 F (37.3 C) (Oral)   Resp 16   Ht 5\' 6"  (1.676 m)   Wt 89.8 kg   SpO2 100%   BMI 31.96 kg/m  Gen:   Awake, no distress   Resp:  Normal effort  MSK:   Moves extremities without difficulty  Other:  Abdominal distention with moderate lower abdominal tenderness  Medical Decision Making  Medically screening exam initiated at 7:54 PM.  Appropriate orders placed.  Anida A Cathell was informed that the remainder of the evaluation will be completed by another provider, this initial triage assessment does not replace that evaluation, and the importance of remaining in the ED until their evaluation is complete.     Myna Bright [place], PA-C 12/22/20 1957    Lucrezia Starch, MD 12/25/20 [phone]

## 2020-12-22 NOTE — ED Triage Notes (Signed)
Pt reports heavy vaginal bleeding X2 weeks with pain in her lower abdomen.  Pt states she feels bloated as well.

## 2020-12-23 DIAGNOSIS — N946 Dysmenorrhea, unspecified: Secondary | ICD-10-CM | POA: Diagnosis not present

## 2020-12-23 DIAGNOSIS — N920 Excessive and frequent menstruation with regular cycle: Secondary | ICD-10-CM | POA: Diagnosis not present

## 2020-12-23 DIAGNOSIS — D649 Anemia, unspecified: Secondary | ICD-10-CM | POA: Diagnosis not present

## 2020-12-23 DIAGNOSIS — D259 Leiomyoma of uterus, unspecified: Secondary | ICD-10-CM | POA: Diagnosis not present

## 2020-12-23 NOTE — ED Notes (Signed)
Patient called x3 for vitals with no response

## 2020-12-30 DIAGNOSIS — N920 Excessive and frequent menstruation with regular cycle: Secondary | ICD-10-CM | POA: Diagnosis not present

## 2020-12-30 DIAGNOSIS — D259 Leiomyoma of uterus, unspecified: Secondary | ICD-10-CM | POA: Diagnosis not present

## 2021-01-11 ENCOUNTER — Other Ambulatory Visit: Payer: Self-pay

## 2021-01-11 ENCOUNTER — Inpatient Hospital Stay (HOSPITAL_BASED_OUTPATIENT_CLINIC_OR_DEPARTMENT_OTHER): Payer: BC Managed Care – PPO | Admitting: Hematology and Oncology

## 2021-01-11 ENCOUNTER — Inpatient Hospital Stay: Payer: BC Managed Care – PPO | Attending: Hematology and Oncology

## 2021-01-11 ENCOUNTER — Other Ambulatory Visit: Payer: Self-pay | Admitting: Hematology and Oncology

## 2021-01-11 VITALS — BP 151/91 | HR 89 | Temp 96.3°F | Resp 18 | Wt 201.8 lb

## 2021-01-11 DIAGNOSIS — M329 Systemic lupus erythematosus, unspecified: Secondary | ICD-10-CM | POA: Insufficient documentation

## 2021-01-11 DIAGNOSIS — N920 Excessive and frequent menstruation with regular cycle: Secondary | ICD-10-CM | POA: Insufficient documentation

## 2021-01-11 DIAGNOSIS — D500 Iron deficiency anemia secondary to blood loss (chronic): Secondary | ICD-10-CM | POA: Insufficient documentation

## 2021-01-11 LAB — CMP (CANCER CENTER ONLY)
ALT: 12 U/L (ref 0–44)
AST: 10 U/L — ABNORMAL LOW (ref 15–41)
Albumin: 4 g/dL (ref 3.5–5.0)
Alkaline Phosphatase: 45 U/L (ref 38–126)
Anion gap: 6 (ref 5–15)
BUN: 12 mg/dL (ref 6–20)
CO2: 21 mmol/L — ABNORMAL LOW (ref 22–32)
Calcium: 8.4 mg/dL — ABNORMAL LOW (ref 8.9–10.3)
Chloride: 110 mmol/L (ref 98–111)
Creatinine: 0.83 mg/dL (ref 0.44–1.00)
GFR, Estimated: >60 mL/min (ref >60)
Glucose, Bld: 98 mg/dL (ref 70–99)
Potassium: 4.4 mmol/L (ref 3.5–5.1)
Sodium: 137 mmol/L (ref 135–145)
Total Bilirubin: <0.2 mg/dL — ABNORMAL LOW (ref 0.3–1.2)
Total Protein: 7.3 g/dL (ref 6.5–8.1)

## 2021-01-11 LAB — CBC WITH DIFFERENTIAL (CANCER CENTER ONLY)
Abs Immature Granulocytes: 0.01 10*3/uL (ref 0.00–0.07)
Basophils Absolute: 0.1 10*3/uL (ref 0.0–0.1)
Basophils Relative: 2 %
Eosinophils Absolute: 0.4 10*3/uL (ref 0.0–0.5)
Eosinophils Relative: 8 %
HCT: 26 % — ABNORMAL LOW (ref 36.0–46.0)
Hemoglobin: 7.7 g/dL — ABNORMAL LOW (ref 12.0–15.0)
Immature Granulocytes: 0 %
Lymphocytes Relative: 37 %
Lymphs Abs: 1.7 10*3/uL (ref 0.7–4.0)
MCH: 20 pg — ABNORMAL LOW (ref 26.0–34.0)
MCHC: 29.6 g/dL — ABNORMAL LOW (ref 30.0–36.0)
MCV: 67.5 fL — ABNORMAL LOW (ref 80.0–100.0)
Monocytes Absolute: 0.4 10*3/uL (ref 0.1–1.0)
Monocytes Relative: 9 %
Neutro Abs: 2.1 10*3/uL (ref 1.7–7.7)
Neutrophils Relative %: 44 %
Platelet Count: 501 10*3/uL — ABNORMAL HIGH (ref 150–400)
RBC: 3.85 MIL/uL — ABNORMAL LOW (ref 3.87–5.11)
RDW: 17.1 % — ABNORMAL HIGH (ref 11.5–15.5)
WBC Count: 4.7 10*3/uL (ref 4.0–10.5)
nRBC: 0 % (ref 0.0–0.2)

## 2021-01-11 LAB — IRON AND TIBC
Iron: 11 ug/dL — ABNORMAL LOW (ref 41–142)
Saturation Ratios: 3 % — ABNORMAL LOW (ref 21–57)
TIBC: 425 ug/dL (ref 236–444)
UIBC: 414 ug/dL — ABNORMAL HIGH (ref 120–384)

## 2021-01-11 LAB — LACTATE DEHYDROGENASE: LDH: 208 U/L — ABNORMAL HIGH (ref 98–192)

## 2021-01-11 LAB — FERRITIN: Ferritin: <4 ng/mL — ABNORMAL LOW (ref 11–307)

## 2021-01-11 NOTE — Progress Notes (Signed)
[place] [phone]   Fax:(336) [phone]  PROGRESS NOTE  Patient Care Team: Patient, No Pcp Per (Inactive) as PCP - General (General Practice)  Hematological/Oncological History # Iron Deficiency Anemia in Setting of Lupus/GYN Bleeding 08/03/2020: WBC 8.7, Hgb 7.0, MCV 65, Plt 318. ESR 30, CRP 3. Vitamin B12 628, folate 9.0. 08/17/2020: establish care with Dr. Lorenso Courier 7/11-09/28/2020: IV iron sucrose 254m x 5 doses 10/19/2020: Hgb 9.4, MCV 79.8, Plt 313 01/11/2021: WBC 4.7, Hgb 7.7, MCV 67.5, Plt 501  Interval History:  Dominique Jones 35y.o. female with medical history significant for iron deficiency anemia in setting of lupus who presents for a follow up visit. The patient's last visit was on 10/19/2020. In the interim since the last visit she has continued to have heavy GYN bleeding.  On exam today Mrs. BArrantsreports that she is beginning to have the "same symptoms" that she was having prior to the iron infusions.  She reports that OB/GYN has had difficulty trying to get her bleeding under control but they recently started her on a hormone pill which appears to be effective.  She also has an upcoming surgery for possible fibroid removal/ablation.  She reports her shortness of breath is worsening and that she is having difficulty with things like walking to the car or from the car to work.  She notes that she had a big boost in energy when she received her last blood transfusion and subsequently felt better after receiving IV iron.  She notes that she tolerated the IV iron therapy well did not have any side effect such as itching, rash, or other side effects.  She denies bleeding from any other sources such as dark stools, nosebleeds, gum bleeding.  She otherwise denies any fevers, chills, sweats, nausea, vomiting or diarrhea.  Full 10 point ROS is listed below.  MEDICAL HISTORY:  Past Medical History:  Diagnosis Date   Complication of anesthesia    difficult  intubation   Ectopic pregnancy    Glaucoma    follows up with opthalmologist once a year for pressure check    SURGICAL HISTORY: Past Surgical History:  Procedure Laterality Date   LAPAROSCOPY N/A 12/26/2012   Procedure: LAPAROSCOPY OPERATIVE REMOVAL OF ECTOPIC PREGNANCY;  Surgeon: TGovernor Specking MD;  [facility];  Service: Gynecology;  Laterality: N/A;  Attempted   LAPAROTOMY N/A 12/26/2012   Procedure: LAPAROTOMY;  Surgeon: TGovernor Specking MD;  [facility];  Service: Gynecology;  Laterality: N/A;   MYOMECTOMY N/A 11/10/2015   Procedure: MYOMECTOMY, Left Salpingectomy, Excision of Incisional Mass;  Surgeon: VEldred Manges MD;  [place] [facility];  Service: Gynecology;  Laterality: N/A;   OPERATIVE ULTRASOUND Right 12/14/2012   Procedure: OPERATIVE ULTRASOUND GUIDED INJECTION OF POTASSIUM CHLORIDE 2.5ML;  Surgeon: TGovernor Specking MD;  [facility]  Service: Gynecology;  Laterality: Right;   REFRACTIVE SURGERY     UNILATERAL SALPINGECTOMY Right 12/26/2012   Procedure: UNILATERAL SALPINGECTOMY;  Surgeon: TGovernor Specking MD;  [facility];  Service: Gynecology;  Laterality: Right;    SOCIAL HISTORY: Social History   Socioeconomic History   Marital status: Single    Spouse name: Not on file   Number of children: Not on file   Years of education: Not on file   Highest education level: Not on file  Occupational History   Not on file  Tobacco Use   Smoking status: Never   Smokeless tobacco: Never  Substance and Sexual Activity  Alcohol use: Yes    Comment: socially   Drug use: No   Sexual activity: Yes    Birth control/protection: None  Other Topics Concern   Not on file  Social History Narrative   Not on file   Social Determinants of Health   Financial Resource Strain: Not on file  Food Insecurity: Not on file  Transportation Needs: Not on file  Physical Activity: Not on file  Stress: Not on file  Social Connections: Not  on file  Intimate Partner Violence: Not on file    FAMILY HISTORY: No family history on file.  ALLERGIES:  has No Known Allergies.  MEDICATIONS:  Current Outpatient Medications  Medication Sig Dispense Refill   acetaminophen (TYLENOL) 500 MG tablet Take 1,000 mg by mouth every 6 (six) hours as needed for moderate pain.     diphenhydrAMINE (BENADRYL) 25 MG tablet Take 25 mg by mouth every 6 (six) hours as needed for allergies.     hydroxychloroquine (PLAQUENIL) 200 MG tablet hydroxychloroquine 200 mg tablet     ibuprofen (ADVIL,MOTRIN) 600 MG tablet 1  po  pc every 6 hours for 5 days then prn-pain (Patient taking differently: 1  po  pc every 6 hours for 5 days then prn-pain) 30 tablet 1   Vitamin D, Ergocalciferol, (DRISDOL) 1.25 MG (50000 UNIT) CAPS capsule Take 50,000 Units by mouth once a week.     No current facility-administered medications for this visit.    REVIEW OF SYSTEMS:   Constitutional: ( - ) fevers, ( - )  chills , ( - ) night sweats Eyes: ( - ) blurriness of vision, ( - ) double vision, ( - ) watery eyes Ears, nose, mouth, throat, and face: ( - ) mucositis, ( - ) sore throat Respiratory: ( - ) cough, ( - ) dyspnea, ( - ) wheezes Cardiovascular: ( - ) palpitation, ( - ) chest discomfort, ( - ) lower extremity swelling Gastrointestinal:  ( - ) nausea, ( - ) heartburn, ( - ) change in bowel habits Skin: ( - ) abnormal skin rashes Lymphatics: ( - ) new lymphadenopathy, ( - ) easy bruising Neurological: ( - ) numbness, ( - ) tingling, ( - ) new weaknesses Behavioral/Psych: ( - ) mood change, ( - ) new changes  All other systems were reviewed with the patient and are negative.  PHYSICAL EXAMINATION: ECOG PERFORMANCE STATUS: 1 - Symptomatic but completely ambulatory  Vitals:   01/11/21 1110  BP: (!) 151/91  Pulse: 89  Resp: 18  Temp: (!) 96.3 F (35.7 C)  SpO2: 100%    Filed Weights   01/11/21 1110  Weight: 201 lb 12.8 oz (91.5 kg)     GENERAL:  Well-appearing middle-aged African-American female, alert, no distress and comfortable SKIN: skin color, texture, turgor are normal, no rashes or significant lesions EYES: conjunctiva are pink and non-injected, sclera clear LUNGS: clear to auscultation and percussion with normal breathing effort HEART: regular rate & rhythm and no murmurs and no lower extremity edema Musculoskeletal: no cyanosis of digits and no clubbing  PSYCH: alert & oriented x 3, fluent speech NEURO: no focal motor/sensory deficits  LABORATORY DATA:  I have reviewed the data as listed CBC Latest Ref Rng & Units 01/11/2021 12/22/2020 10/19/2020  WBC 4.0 - 10.5 K/uL 4.7 8.5 4.9  Hemoglobin 12.0 - 15.0 g/dL 7.7(L) 8.1(L) 9.4(L)  Hematocrit 36.0 - 46.0 % 26.0(L) 26.8(L) 30.1(L)  Platelets 150 - 400 K/uL 501(H) 439(H) 313  CMP Latest Ref Rng & Units 01/11/2021 12/22/2020 10/19/2020  Glucose 70 - 99 mg/dL 98 90 108(H)  BUN 6 - 20 mg/dL 12 11 14   Creatinine 0.44 - 1.00 mg/dL 0.83 0.72 0.86  Sodium 135 - 145 mmol/L 137 137 140  Potassium 3.5 - 5.1 mmol/L 4.4 3.6 4.1  Chloride 98 - 111 mmol/L 110 107 108  CO2 22 - 32 mmol/L 21(L) 23 25  Calcium 8.9 - 10.3 mg/dL 8.4(L) 8.5(L) 8.6(L)  Total Protein 6.5 - 8.1 g/dL 7.3 - 6.9  Total Bilirubin 0.3 - 1.2 mg/dL <0.2(L) - <0.2(L)  Alkaline Phos 38 - 126 U/L 45 - 52  AST 15 - 41 U/L 10(L) - 11(L)  ALT 0 - 44 U/L 12 - 7    RADIOGRAPHIC STUDIES: I have personally reviewed the radiological images as listed and agreed with the findings in the report. US PELVIS TRANSVAGINAL NON-OB (TV ONLY)  Result Date: 12/22/2020 CLINICAL DATA:  Vaginal bleeding. EXAM: ULTRASOUND PELVIS TRANSVAGINAL TECHNIQUE: Transvaginal ultrasound examination of the pelvis was performed including evaluation of the uterus, ovaries, adnexal regions, and pelvic cul-de-sac. COMPARISON:  Obstetrical ultrasound dated 12/14/2012. FINDINGS: Uterus Measurements: 16 x 8 x 9 cm = volume: 600 mL. The uterus is anteverted  and heterogeneous. There is a 5.1 x 4.5 x 4.5 cm posterior body fibroid, a 4.4 x 3.8 x 3.8 cm left lower posterior fibroid, and a 4.5 x 3.6 x 3.9 cm right anterior fundal fibroid. Endometrium Thickness: 9 mm. The endometrium is poorly visualized and suboptimally evaluated. Right ovary Not visualized. Left ovary Not visualized. Other findings:  No abnormal free fluid IMPRESSION: 1. Enlarged myomatous uterus. 2. Poor visualization of the endometrium and nonvisualization of the ovaries. Electronically Signed   By: Anner Crete M.D.   On: 12/22/2020 20:47    ASSESSMENT & PLAN Tomasa Blase 35 y.o. female with medical history significant for iron deficiency anemia in setting of lupus/heavy GYN bleeding who presents for a follow up visit.   After review of the labs, review of the records, and discussion with the patient the patients findings are most consistent with multifactorial iron deficiency anemia.  This is likely secondary to the patient's heavy menstrual cycles and history of lupus.  We proceeded with IV iron sucrose 263m x 5 doses with a moderate response in the Hgb.  Iron labs from today are still pending.  In addition to her iron deficiency it is likely that her hemoglobin is suppressed by her chronic inflammatory condition.  The patient voiced understanding of this plan moving forward.  The patient notes that she is out of sick leave from work and is afraid that if she tries to take an additional 5 days off for continued IV iron sucrose that she may lose her job.  She is requesting fewer sessions so that she may keep her employment.  Additionally 1 large dose would be preferred in the setting of her active bleeding.   #Iron Deficiency Anemia in Setting of Lupus -- Findings are consistent with iron deficiency anemia due to multifactorial causes including inflammatory condition of lupus and heavy menstrual cycles. --received 5 doss of 2048mIV iron sucrose from 7/11-09/28/2020 with modest bump in  Hgb to 9.4  --Hgb drop to 7.7 today, symptoms returning. Iron panel pending -- Plan to proceed with Monoferric 1000 mg IV once in order to bolster her iron levels.  This is preferred for the patient as she is no longer able to take any more time off  work and is afraid that if she has to do 5 sessions that she may lose her job. --RTC 4-6 weeks after last dose of IV iron.  No orders of the defined types were placed in this encounter.   All questions were answered. The patient knows to call the clinic with any problems, questions or concerns.  A total of more than 30 minutes were spent on this encounter with face-to-face time and non-face-to-face time, including preparing to see the patient, ordering tests and/or medications, counseling the patient and coordination of care as outlined above.   Ledell Peoples, MD Department of Hematology/Oncology [place] [facility] Phone: [phone] Pager: [phone] Email: [email or web address]  01/12/2021 8:53 AM

## 2021-01-12 ENCOUNTER — Telehealth: Payer: Self-pay

## 2021-01-12 ENCOUNTER — Encounter: Payer: Self-pay | Admitting: Hematology and Oncology

## 2021-01-12 NOTE — Telephone Encounter (Signed)
Pt called wanting to know about having short term disability paperwork completed. Pt has been advised to drop off her paperwork for completion. Pt expressed understanding of this information.

## 2021-01-22 ENCOUNTER — Encounter: Payer: Self-pay | Admitting: Hematology and Oncology

## 2021-01-22 ENCOUNTER — Telehealth: Payer: Self-pay | Admitting: Hematology and Oncology

## 2021-01-22 NOTE — Telephone Encounter (Signed)
Scheduled per infusion nurses, called and informed patient regarding rescheduled appointments. Left a voicemail

## 2021-01-23 ENCOUNTER — Inpatient Hospital Stay: Payer: BC Managed Care – PPO

## 2021-01-26 ENCOUNTER — Other Ambulatory Visit: Payer: Self-pay

## 2021-01-26 ENCOUNTER — Encounter: Payer: Self-pay | Admitting: Hematology and Oncology

## 2021-01-26 ENCOUNTER — Inpatient Hospital Stay: Payer: BC Managed Care – PPO | Attending: Hematology and Oncology

## 2021-01-26 VITALS — BP 137/94 | HR 85 | Temp 98.8°F | Resp 18

## 2021-01-26 DIAGNOSIS — N920 Excessive and frequent menstruation with regular cycle: Secondary | ICD-10-CM | POA: Insufficient documentation

## 2021-01-26 DIAGNOSIS — D500 Iron deficiency anemia secondary to blood loss (chronic): Secondary | ICD-10-CM | POA: Insufficient documentation

## 2021-01-26 MED ORDER — SODIUM CHLORIDE 0.9 % IV SOLN
1000.0000 mg | Freq: Once | INTRAVENOUS | Status: AC
  Administered 2021-01-26: 1000 mg via INTRAVENOUS
  Filled 2021-01-26: qty 10

## 2021-01-26 MED ORDER — SODIUM CHLORIDE 0.9 % IV SOLN: Freq: Once | INTRAVENOUS | Status: AC

## 2021-01-26 NOTE — Patient Instructions (Signed)

## 2021-01-26 NOTE — Progress Notes (Signed)
Pt stayed for 30 min post iron, no complaints. Pt discharged in stable condition, VSS and AVS reviewed and in hand. No further questions or concerns at time of discharge.

## 2021-01-30 ENCOUNTER — Ambulatory Visit: Payer: BC Managed Care – PPO

## 2021-02-05 ENCOUNTER — Other Ambulatory Visit: Payer: Self-pay | Admitting: *Deleted

## 2021-02-05 DIAGNOSIS — D500 Iron deficiency anemia secondary to blood loss (chronic): Secondary | ICD-10-CM

## 2021-02-08 ENCOUNTER — Inpatient Hospital Stay: Payer: BC Managed Care – PPO

## 2021-02-08 ENCOUNTER — Inpatient Hospital Stay: Payer: BC Managed Care – PPO | Admitting: Hematology and Oncology

## 2021-02-09 ENCOUNTER — Encounter: Payer: Self-pay | Admitting: Hematology and Oncology

## 2021-02-09 NOTE — Telephone Encounter (Signed)
02/09/2021 1311 Connected with Dominique Jones [phone] (home) advising to check e-mail for work accommodation form from "[employer] from [employer]" to upload to [employer] portal. Currently no needs or questions.

## 2021-03-02 ENCOUNTER — Inpatient Hospital Stay: Payer: BC Managed Care – PPO

## 2021-03-02 ENCOUNTER — Inpatient Hospital Stay: Payer: BC Managed Care – PPO | Admitting: Hematology and Oncology

## 2021-03-02 ENCOUNTER — Other Ambulatory Visit: Payer: Self-pay | Admitting: Hematology and Oncology

## 2021-03-02 DIAGNOSIS — D500 Iron deficiency anemia secondary to blood loss (chronic): Secondary | ICD-10-CM

## 2021-03-03 ENCOUNTER — Telehealth: Payer: Self-pay | Admitting: Hematology and Oncology

## 2021-03-03 NOTE — Telephone Encounter (Signed)
Sch per 1/10 inbasket, pt aware °

## 2021-03-08 DIAGNOSIS — N920 Excessive and frequent menstruation with regular cycle: Secondary | ICD-10-CM | POA: Diagnosis not present

## 2021-03-15 ENCOUNTER — Encounter: Payer: Self-pay | Admitting: Hematology and Oncology

## 2021-03-15 ENCOUNTER — Inpatient Hospital Stay: Payer: BC Managed Care – PPO | Attending: Hematology and Oncology

## 2021-03-15 ENCOUNTER — Other Ambulatory Visit: Payer: Self-pay

## 2021-03-15 ENCOUNTER — Inpatient Hospital Stay (HOSPITAL_BASED_OUTPATIENT_CLINIC_OR_DEPARTMENT_OTHER): Payer: BC Managed Care – PPO | Admitting: Hematology and Oncology

## 2021-03-15 VITALS — BP 157/95 | HR 76 | Temp 98.5°F | Resp 17 | Wt 198.1 lb

## 2021-03-15 DIAGNOSIS — N920 Excessive and frequent menstruation with regular cycle: Secondary | ICD-10-CM | POA: Diagnosis not present

## 2021-03-15 DIAGNOSIS — M329 Systemic lupus erythematosus, unspecified: Secondary | ICD-10-CM | POA: Diagnosis not present

## 2021-03-15 DIAGNOSIS — D500 Iron deficiency anemia secondary to blood loss (chronic): Secondary | ICD-10-CM

## 2021-03-15 LAB — FERRITIN: Ferritin: 9 ng/mL — ABNORMAL LOW (ref 11–307)

## 2021-03-15 LAB — CBC WITH DIFFERENTIAL (CANCER CENTER ONLY)
Abs Immature Granulocytes: 0 10*3/uL (ref 0.00–0.07)
Basophils Absolute: 0.1 10*3/uL (ref 0.0–0.1)
Basophils Relative: 2 %
Eosinophils Absolute: 0.6 10*3/uL — ABNORMAL HIGH (ref 0.0–0.5)
Eosinophils Relative: 11 %
HCT: 29.8 % — ABNORMAL LOW (ref 36.0–46.0)
Hemoglobin: 8.8 g/dL — ABNORMAL LOW (ref 12.0–15.0)
Immature Granulocytes: 0 %
Lymphocytes Relative: 43 %
Lymphs Abs: 2.2 10*3/uL (ref 0.7–4.0)
MCH: 22.6 pg — ABNORMAL LOW (ref 26.0–34.0)
MCHC: 29.5 g/dL — ABNORMAL LOW (ref 30.0–36.0)
MCV: 76.4 fL — ABNORMAL LOW (ref 80.0–100.0)
Monocytes Absolute: 0.5 10*3/uL (ref 0.1–1.0)
Monocytes Relative: 10 %
Neutro Abs: 1.8 10*3/uL (ref 1.7–7.7)
Neutrophils Relative %: 34 %
Platelet Count: 432 10*3/uL — ABNORMAL HIGH (ref 150–400)
RBC: 3.9 MIL/uL (ref 3.87–5.11)
RDW: 23.9 % — ABNORMAL HIGH (ref 11.5–15.5)
WBC Count: 5.1 10*3/uL (ref 4.0–10.5)
nRBC: 0 % (ref 0.0–0.2)

## 2021-03-15 LAB — CMP (CANCER CENTER ONLY)
ALT: 13 U/L (ref 0–44)
AST: 15 U/L (ref 15–41)
Albumin: 4.1 g/dL (ref 3.5–5.0)
Alkaline Phosphatase: 52 U/L (ref 38–126)
Anion gap: 5 (ref 5–15)
BUN: 22 mg/dL — ABNORMAL HIGH (ref 6–20)
CO2: 24 mmol/L (ref 22–32)
Calcium: 8.6 mg/dL — ABNORMAL LOW (ref 8.9–10.3)
Chloride: 109 mmol/L (ref 98–111)
Creatinine: 0.76 mg/dL (ref 0.44–1.00)
GFR, Estimated: >60 mL/min (ref >60)
Glucose, Bld: 95 mg/dL (ref 70–99)
Potassium: 3.7 mmol/L (ref 3.5–5.1)
Sodium: 138 mmol/L (ref 135–145)
Total Bilirubin: 0.3 mg/dL (ref 0.3–1.2)
Total Protein: 7.1 g/dL (ref 6.5–8.1)

## 2021-03-15 LAB — IRON AND IRON BINDING CAPACITY (CC-WL,HP ONLY)
Iron: 26 ug/dL — ABNORMAL LOW (ref 28–170)
Saturation Ratios: 7 % — ABNORMAL LOW (ref 10.4–31.8)
TIBC: 375 ug/dL (ref 250–450)
UIBC: 349 ug/dL (ref 148–442)

## 2021-03-15 LAB — LACTATE DEHYDROGENASE: LDH: 138 U/L (ref 98–192)

## 2021-03-15 NOTE — Progress Notes (Signed)
[facility] [phone]   Fax:([phone]) [phone]  PROGRESS NOTE  Patient Care Team: Patient, No Pcp Per (Inactive) as PCP - General (General Practice)  Hematological/Oncological History # Iron Deficiency Anemia in Setting of Lupus/GYN Bleeding 08/03/2020: WBC 8.7, Hgb 7.0, MCV 65, Plt 318. ESR 30, CRP 3. Vitamin B12 628, folate 9.0. 08/17/2020: establish care with Dr. Lorenso Courier 7/11-09/28/2020: IV iron sucrose 248m x 5 doses 10/19/2020: Hgb 9.4, MCV 79.8, Plt 313 01/11/2021: WBC 4.7, Hgb 7.7, MCV 67.5, Plt 501 01/26/2021: 1 dose of IV monoferric 10071m1/23/2023: WBC 5.1, Hgb 8.8, MCV 76.4, Plt 432   Interval History:  Dominique Jones [age].o. female with medical history significant for iron deficiency anemia in setting of lupus who presents for a follow up visit. The patient's last visit was on 01/11/2021. In the interim since the last visit she has continued to have heavy GYN bleeding, particularly last month.  On exam today Dominique Jones he has been doing "better this month".  She reports that December 2022 she suffered from "super heavy" menstrual cycles.  She reports that she has been evaluated by OB/GYN who is currently planning for a fibroidectomy to be scheduled in the near future.  A date has not yet been set for this.  She reports that her energy levels are about 60%.  Her appetite is been "okay".  She notes that she is beginning to lose some hair and has been having difficulty with insomnia at night.  Fortunately her lupus has been under good control with no recent flares.  She notes that she has been doing her best to try to increase intake of greens and red meat to boost her iron levels.  She denies bleeding from any other sources such as dark stools, nosebleeds, gum bleeding.  She otherwise denies any fevers, chills, sweats, nausea, vomiting or diarrhea.  Full 10 point ROS is listed below.  MEDICAL HISTORY:  Past Medical History:  Diagnosis Date    Complication of anesthesia    difficult intubation   Ectopic pregnancy    Glaucoma    follows up with opthalmologist once a year for pressure check    SURGICAL HISTORY: Past Surgical History:  Procedure Laterality Date   LAPAROSCOPY N/A 12/26/2012   Procedure: LAPAROSCOPY OPERATIVE REMOVAL OF ECTOPIC PREGNANCY;  Surgeon: TaGovernor SpeckingMD;  [facility];  Service: Gynecology;  Laterality: N/A;  Attempted   LAPAROTOMY N/A 12/26/2012   Procedure: LAPAROTOMY;  Surgeon: TaGovernor SpeckingMD;  [facility];  Service: Gynecology;  Laterality: N/A;   MYOMECTOMY N/A 11/10/2015   Procedure: MYOMECTOMY, Left Salpingectomy, Excision of Incisional Mass;  Surgeon: VaEldred MangesMD;  [facility];  Service: Gynecology;  Laterality: N/A;   OPERATIVE ULTRASOUND Right 12/14/2012   Procedure: OPERATIVE ULTRASOUND GUIDED INJECTION OF POTASSIUM CHLORIDE 2.5ML;  Surgeon: TaGovernor SpeckingMD;  [facility] Service: [facility];  Laterality: Right;   REFRACTIVE SURGERY     UNILATERAL SALPINGECTOMY Right 12/26/2012   Procedure: UNILATERAL SALPINGECTOMY;  Surgeon: TaGovernor SpeckingMD;  [facility];  Service: Gynecology;  Laterality: Right;    SOCIAL HISTORY: Social History   Socioeconomic History   Marital status: Single    Spouse name: Not on file   Number of children: Not on file   Years of education: Not on file   Highest education level: Not on file  Occupational History   Not on file  Tobacco Use   Smoking status: Never  Smokeless tobacco: Never  Substance and Sexual Activity   Alcohol use: Yes    Comment: socially   Drug use: No   Sexual activity: Yes    Birth control/protection: None  Other Topics Concern   Not on file  Social History Narrative   Not on file   Social Determinants of Health   Financial Resource Strain: Not on file  Food Insecurity: Not on file  Transportation Needs: Not on file  Physical Activity: Not on file   Stress: Not on file  Social Connections: Not on file  Intimate Partner Violence: Not on file    FAMILY HISTORY: No family history on file.  ALLERGIES:  has No Known Allergies.  MEDICATIONS:  Current Outpatient Medications  Medication Sig Dispense Refill   acetaminophen (TYLENOL) 500 MG tablet Take 1,000 mg by mouth every 6 (six) hours as needed for moderate pain.     diphenhydrAMINE (BENADRYL) 25 MG tablet Take 25 mg by mouth every 6 (six) hours as needed for allergies.     hydroxychloroquine (PLAQUENIL) 200 MG tablet hydroxychloroquine 200 mg tablet     ibuprofen (ADVIL,MOTRIN) 600 MG tablet 1  po  pc every 6 hours for 5 days then prn-pain (Patient taking differently: 1  po  pc every 6 hours for 5 days then prn-pain) 30 tablet 1   Vitamin D, Ergocalciferol, (DRISDOL) 1.25 MG (50000 UNIT) CAPS capsule Take 50,000 Units by mouth once a week.     No current facility-administered medications for this visit.    REVIEW OF SYSTEMS:   Constitutional: ( - ) fevers, ( - )  chills , ( - ) night sweats Eyes: ( - ) blurriness of vision, ( - ) double vision, ( - ) watery eyes Ears, nose, mouth, throat, and face: ( - ) mucositis, ( - ) sore throat Respiratory: ( - ) cough, ( - ) dyspnea, ( - ) wheezes Cardiovascular: ( - ) palpitation, ( - ) chest discomfort, ( - ) lower extremity swelling Gastrointestinal:  ( - ) nausea, ( - ) heartburn, ( - ) change in bowel habits Skin: ( - ) abnormal skin rashes Lymphatics: ( - ) new lymphadenopathy, ( - ) easy bruising Neurological: ( - ) numbness, ( - ) tingling, ( - ) new weaknesses Behavioral/Psych: ( - ) mood change, ( - ) new changes  All other systems were reviewed with the patient and are negative.  PHYSICAL EXAMINATION: ECOG PERFORMANCE STATUS: 1 - Symptomatic but completely ambulatory  Vitals:   03/15/21 1010  BP: (!) 157/95  Pulse: 76  Resp: 17  Temp: 98.5 F (36.9 C)  SpO2: 100%    Filed Weights   03/15/21 1010  Weight: 198 lb  1.6 oz (89.9 kg)    GENERAL: Well-appearing middle-aged African-American female, alert, no distress and comfortable SKIN: skin color, texture, turgor are normal, no rashes or significant lesions EYES: conjunctiva are pink and non-injected, sclera clear LUNGS: clear to auscultation and percussion with normal breathing effort HEART: regular rate & rhythm and no murmurs and no lower extremity edema Musculoskeletal: no cyanosis of digits and no clubbing  PSYCH: alert & oriented x 3, fluent speech NEURO: no focal motor/sensory deficits  LABORATORY DATA:  I have reviewed the data as listed CBC Latest Ref Rng & Units 03/15/2021 01/11/2021 12/22/2020  WBC 4.0 - 10.5 K/uL 5.1 4.7 8.5  Hemoglobin 12.0 - 15.0 g/dL 8.8(L) 7.7(L) 8.1(L)  Hematocrit 36.0 - 46.0 % 29.8(L) 26.0(L) 26.8(L)  Platelets 150 -  400 K/uL 432(H) 501(H) 439(H)    CMP Latest Ref Rng & Units 03/15/2021 01/11/2021 12/22/2020  Glucose 70 - 99 mg/dL 95 98 90  BUN 6 - 20 mg/dL 22(H) 12 11  Creatinine 0.44 - 1.00 mg/dL 0.76 0.83 0.72  Sodium 135 - 145 mmol/L 138 137 137  Potassium 3.5 - 5.1 mmol/L 3.7 4.4 3.6  Chloride 98 - 111 mmol/L 109 110 107  CO2 22 - 32 mmol/L 24 21(L) 23  Calcium 8.9 - 10.3 mg/dL 8.6(L) 8.4(L) 8.5(L)  Total Protein 6.5 - 8.1 g/dL 7.1 7.3 -  Total Bilirubin 0.3 - 1.2 mg/dL 0.3 <0.2(L) -  Alkaline Phos 38 - 126 U/L 52 45 -  AST 15 - 41 U/L 15 10(L) -  ALT 0 - 44 U/L 13 12 -    RADIOGRAPHIC STUDIES: I have personally reviewed the radiological images as listed and agreed with the findings in the report. No results found.  ASSESSMENT & PLAN Dominique Jones 36 y.o. female with medical history significant for iron deficiency anemia in setting of lupus/heavy GYN bleeding who presents for a follow up visit.   After review of the labs, review of the records, and discussion with the patient the patients findings are most consistent with multifactorial iron deficiency anemia.  This is likely secondary to the  patient's heavy menstrual cycles and history of lupus.  We proceeded with IV iron sucrose 219m x 5 doses with a moderate response in the Hgb.  Iron labs from today are still pending.  In addition to her iron deficiency it is likely that her hemoglobin is suppressed by her chronic inflammatory condition.  The patient voiced understanding of this plan moving forward.  The patient notes that she is out of sick leave from work and is afraid that if she tries to take an additional 5 days off for continued IV iron sucrose that she may lose her job.  She is requesting fewer sessions so that she may keep her employment.  Additionally 1 large dose would be preferred in the setting of her active bleeding.   #Iron Deficiency Anemia in Setting of Lupus -- Findings are consistent with iron deficiency anemia due to multifactorial causes including inflammatory condition of lupus and heavy menstrual cycles. --received 5 doss of 2056mIV iron sucrose from 7/11-09/28/2020 with modest bump in Hgb to 9.4.  --Hgb at 8.8. Iron panel pending. Patient energy levels are improved though last month she had a heavy menstrual cycle.  -- If more IV iron is required will plan to proceed with Monoferric 1000 mg IV once in order to bolster her iron levels.  This is preferred for the patient as she is no longer able to take any more time off work and is afraid that if she has to do 5 sessions that she may lose her job. --RTC in 6 months or sooner if Hgb levels drop or more IV iron is required prior to her GYN surgery.   No orders of the defined types were placed in this encounter.  All questions were answered. The patient knows to call the clinic with any problems, questions or concerns.  A total of more than 30 minutes were spent on this encounter with face-to-face time and non-face-to-face time, including preparing to see the patient, ordering tests and/or medications, counseling the patient and coordination of care as outlined  above.   JoLedell PeoplesMD Department of Hematology/Oncology [facility]: [phone]:  [phone] Email: [email or web address]  03/15/2021 1:04 PM

## 2021-03-16 ENCOUNTER — Telehealth: Payer: Self-pay | Admitting: Hematology and Oncology

## 2021-03-16 NOTE — Telephone Encounter (Signed)
Scheduled per 1/23 los, message was left with pt

## 2021-03-29 ENCOUNTER — Other Ambulatory Visit: Payer: Self-pay | Admitting: Obstetrics and Gynecology

## 2021-04-26 DIAGNOSIS — D649 Anemia, unspecified: Secondary | ICD-10-CM | POA: Diagnosis not present

## 2021-04-28 NOTE — Progress Notes (Signed)
Surgical Instructions ? ? ? Your procedure is scheduled on Monday, March 13th, 2023. ? ? Report to [facility] Main Entrance "A" at 11:00 A.M., then check in with the Admitting office. ? Call this number if you have problems the morning of surgery: ? [phone] ? ? If you have any questions prior to your surgery date call [phone]: Open Monday-Friday 8am-4pm ? ? ? Remember: ? Do not eat after midnight the night before your surgery ? ?You may drink clear liquids until 10:00 the morning of your surgery.   ?Clear liquids allowed are: Water, Non-Citrus Juices (without pulp), Carbonated Beverages, Clear Tea, Black Coffee ONLY (NO MILK, CREAM OR POWDERED CREAMER of any kind), and Gatorade ?  ? Take these medicines the morning of surgery with A SIP OF WATER:  ? ?acetaminophen (TYLENOL) - if needed ?diphenhydrAMINE (BENADRYL) - if needed ? ?Ask MD if you must hold hydroxychloroquine (PLAQUENIL) prior your surgery ? ?As of today, STOP taking any Aspirin (unless otherwise instructed by your surgeon) Aleve, Naproxen, Ibuprofen, Motrin, Advil, Goody's, BC's, all herbal medications, fish oil, and all vitamins. ? ? ? The day of surgery: ?         ?Do not wear jewelry or makeup ?Do not wear lotions, powders, perfumes, or deodorant. ?Do not shave 48 hours prior to surgery.   ?Do not bring valuables to the hospital. ?Do not wear nail polish, gel polish, artificial nails, or any other type of covering on natural nails (fingers and toes) ?If you have artificial nails or gel coating that need to be removed by a nail salon, please have this removed prior to surgery. Artificial nails or gel coating may interfere with anesthesia's ability to adequately monitor your vital signs. ? ? ?[facility] is not responsible for any belongings or valuables. .  ? ?Do NOT Smoke (Tobacco/Vaping)  24 hours prior to your procedure ? ?If you use a CPAP at night, you may bring your mask for your overnight stay. ?  ?Contacts, glasses, hearing aids,  dentures or partials may not be worn into surgery, please bring cases for these belongings ?  ?For patients admitted to the hospital, discharge time will be determined by your treatment team. ?  ?Patients discharged the day of surgery will not be allowed to drive home, and someone needs to stay with them for 24 hours. ? ?NO VISITORS WILL BE ALLOWED IN PRE-OP WHERE PATIENTS ARE PREPPED FOR SURGERY.  ONLY 1 SUPPORT PERSON MAY BE PRESENT IN THE WAITING ROOM WHILE YOU ARE IN SURGERY.  IF YOU ARE TO BE ADMITTED, ONCE YOU ARE IN YOUR ROOM YOU WILL BE ALLOWED TWO (2) VISITORS. 1 (ONE) VISITOR MAY STAY OVERNIGHT BUT MUST ARRIVE TO THE ROOM BY 8pm.  Minor children may have two parents present. Special consideration for safety and communication needs will be reviewed on a case by case basis. ? ?Special instructions:   ? ?Oral Hygiene is also important to reduce your risk of infection.  Remember - BRUSH YOUR TEETH THE MORNING OF SURGERY WITH YOUR REGULAR TOOTHPASTE ? ? ?[facility]- Preparing For Surgery ? ?Before surgery, you can play an important role. Because skin is not sterile, your skin needs to be as free of germs as possible. You can reduce the number of germs on your skin by washing with CHG (chlorahexidine gluconate) Soap before surgery.  CHG is an antiseptic cleaner which kills germs and bonds with the skin to continue killing germs even after washing.   ? ? ?  Please do not use if you have an allergy to CHG or antibacterial soaps. If your skin becomes reddened/irritated stop using the CHG.  ?Do not shave (including legs and underarms) for at least 48 hours prior to first CHG shower. It is OK to shave your face. ? ?Please follow these instructions carefully. ?  ? ? Shower the NIGHT BEFORE SURGERY and the MORNING OF SURGERY with CHG Soap.  ? If you chose to wash your hair, wash your hair first as usual with your normal shampoo. After you shampoo, rinse your hair and body thoroughly to remove the shampoo.  Then [employer] and genitals (private parts) with your normal soap and rinse thoroughly to remove soap. ? ?After that Use CHG Soap as you would any other liquid soap. You can apply CHG directly to the skin and wash gently with a scrungie or a clean washcloth.  ? ?Apply the CHG Soap to your body ONLY FROM THE NECK DOWN.  Do not use on open wounds or open sores. Avoid contact with your eyes, ears, mouth and genitals (private parts). Wash Face and genitals (private parts)  with your normal soap.  ? ?Wash thoroughly, paying special attention to the area where your surgery will be performed. ? ?Thoroughly rinse your body with warm water from the neck down. ? ?DO NOT shower/wash with your normal soap after using and rinsing off the CHG Soap. ? ?Pat yourself dry with a CLEAN TOWEL. ? ?Wear CLEAN PAJAMAS to bed the night before surgery ? ?Place CLEAN SHEETS on your bed the night before your surgery ? ?DO NOT SLEEP WITH PETS. ? ? ?Day of Surgery: ? ?Take a shower with CHG soap. ?Wear Clean/Comfortable clothing the morning of surgery ?Do not apply any deodorants/lotions.   ?Remember to brush your teeth WITH YOUR REGULAR TOOTHPASTE. ? ? ? ?COVID testing ? ?If you are going to stay overnight or be admitted after your procedure/surgery and require a pre-op COVID test, please follow these instructions after your COVID test  ? ?You are not required to quarantine however you are required to wear a well-fitting mask when you are out and around people not in your household.  If your mask becomes wet or soiled, replace with a new one. ? ?Wash your hands often with soap and water for 20 seconds or clean your hands with an alcohol-based hand sanitizer that contains at least 60% alcohol. ? ?Do not share personal items. ? ?Notify your provider: ?if you are in close contact with someone who has COVID  ?or if you develop a fever of 100.4 or greater, sneezing, cough, sore throat, shortness of breath or body aches. ? ?  ?Please read over the following  fact sheets that you were given.   ?

## 2021-04-29 ENCOUNTER — Encounter (HOSPITAL_COMMUNITY)
Admission: RE | Admit: 2021-04-29 | Discharge: 2021-04-29 | Disposition: A | Discharge status: Home / Self Care | Payer: BC Managed Care – PPO | Source: Ambulatory Visit | Attending: Obstetrics and Gynecology | Admitting: Obstetrics and Gynecology

## 2021-04-29 ENCOUNTER — Encounter (HOSPITAL_COMMUNITY): Payer: Self-pay

## 2021-04-29 ENCOUNTER — Other Ambulatory Visit: Payer: Self-pay

## 2021-04-29 VITALS — BP 145/97 | HR 93 | Temp 98.4°F | Resp 17 | Ht 66.0 in | Wt 195.6 lb

## 2021-04-29 DIAGNOSIS — Z20822 Contact with and (suspected) exposure to covid-19: Secondary | ICD-10-CM | POA: Diagnosis not present

## 2021-04-29 DIAGNOSIS — Z01812 Encounter for preprocedural laboratory examination: Secondary | ICD-10-CM | POA: Diagnosis not present

## 2021-04-29 DIAGNOSIS — Z01818 Encounter for other preprocedural examination: Secondary | ICD-10-CM

## 2021-04-29 HISTORY — DX: Reserved for concepts with insufficient information to code with codable children: IMO0002

## 2021-04-29 HISTORY — DX: Systemic lupus erythematosus, unspecified: M32.9

## 2021-04-29 HISTORY — DX: Anemia, unspecified: D64.9

## 2021-04-29 LAB — BASIC METABOLIC PANEL
Anion gap: 7 (ref 5–15)
BUN: 11 mg/dL (ref 6–20)
CO2: 26 mmol/L (ref 22–32)
Calcium: 8.6 mg/dL — ABNORMAL LOW (ref 8.9–10.3)
Chloride: 106 mmol/L (ref 98–111)
Creatinine, Ser: 0.73 mg/dL (ref 0.44–1.00)
GFR, Estimated: >60 mL/min (ref >60)
Glucose, Bld: 104 mg/dL — ABNORMAL HIGH (ref 70–99)
Potassium: 4.2 mmol/L (ref 3.5–5.1)
Sodium: 139 mmol/L (ref 135–145)

## 2021-04-29 LAB — SARS CORONAVIRUS 2 (TAT 6-24 HRS): SARS Coronavirus 2: NEGATIVE

## 2021-04-29 NOTE — Progress Notes (Signed)
Surgical Instructions ? ? ? Your procedure is scheduled on Monday, March 13th, 2023. ? ? Report to [facility] Main Entrance "A" at 11:00 A.M., then check in with the Admitting office. ? Call this number if you have problems the morning of surgery: ? [phone] ? ? If you have any questions prior to your surgery date call [phone]: Open Monday-Friday 8am-4pm ? ? ? Remember: ? Do not eat or drink anything after midnight the night before your surgery ? ?  ? Take these medicines the morning of surgery with A SIP OF WATER:  ? ?acetaminophen (TYLENOL) - if needed ?diphenhydrAMINE (BENADRYL) - if needed ? ?Ask MD if you must hold hydroxychloroquine (PLAQUENIL) prior your surgery ? ?As of today, STOP taking any Aspirin (unless otherwise instructed by your surgeon) Aleve, Naproxen, Ibuprofen, Motrin, Advil, Goody's, BC's, all herbal medications, fish oil, and all vitamins. ? ? ? The day of surgery: ?         ?Do not wear jewelry or makeup ?Do not wear lotions, powders, perfumes, or deodorant. ?Do not shave 48 hours prior to surgery.   ?Do not bring valuables to the hospital. ?Do not wear nail polish, gel polish, artificial nails, or any other type of covering on natural nails (fingers and toes) ?If you have artificial nails or gel coating that need to be removed by a nail salon, please have this removed prior to surgery. Artificial nails or gel coating may interfere with anesthesia's ability to adequately monitor your vital signs. ? ? ?[facility] is not responsible for any belongings or valuables. .  ? ?Do NOT Smoke (Tobacco/Vaping)  24 hours prior to your procedure ? ?If you use a CPAP at night, you may bring your mask for your overnight stay. ?  ?Contacts, glasses, hearing aids, dentures or partials may not be worn into surgery, please bring cases for these belongings ?  ?For patients admitted to the hospital, discharge time will be determined by your treatment team. ?  ?Patients discharged the day of surgery will  not be allowed to drive home, and someone needs to stay with them for 24 hours. ? ?NO VISITORS WILL BE ALLOWED IN PRE-OP WHERE PATIENTS ARE PREPPED FOR SURGERY.  ONLY 1 SUPPORT PERSON MAY BE PRESENT IN THE WAITING ROOM WHILE YOU ARE IN SURGERY.  IF YOU ARE TO BE ADMITTED, ONCE YOU ARE IN YOUR ROOM YOU WILL BE ALLOWED TWO (2) VISITORS. 1 (ONE) VISITOR MAY STAY OVERNIGHT BUT MUST ARRIVE TO THE ROOM BY 8pm.  Minor children may have two parents present. Special consideration for safety and communication needs will be reviewed on a case by case basis. ? ?Special instructions:   ? ?Oral Hygiene is also important to reduce your risk of infection.  Remember - BRUSH YOUR TEETH THE MORNING OF SURGERY WITH YOUR REGULAR TOOTHPASTE ? ? ?[facility]- Preparing For Surgery ? ?Before surgery, you can play an important role. Because skin is not sterile, your skin needs to be as free of germs as possible. You can reduce the number of germs on your skin by washing with CHG (chlorahexidine gluconate) Soap before surgery.  CHG is an antiseptic cleaner which kills germs and bonds with the skin to continue killing germs even after washing.   ? ? ?Please do not use if you have an allergy to CHG or antibacterial soaps. If your skin becomes reddened/irritated stop using the CHG.  ?Do not shave (including legs and underarms) for at least 48 hours prior to  first CHG shower. It is OK to shave your face. ? ?Please follow these instructions carefully. ?  ? ? Shower the NIGHT BEFORE SURGERY and the MORNING OF SURGERY with CHG Soap.  ? If you chose to wash your hair, wash your hair first as usual with your normal shampoo. After you shampoo, rinse your hair and body thoroughly to remove the shampoo.  Then [employer] and genitals (private parts) with your normal soap and rinse thoroughly to remove soap. ? ?After that Use CHG Soap as you would any other liquid soap. You can apply CHG directly to the skin and wash gently with a scrungie or a clean  washcloth.  ? ?Apply the CHG Soap to your body ONLY FROM THE NECK DOWN.  Do not use on open wounds or open sores. Avoid contact with your eyes, ears, mouth and genitals (private parts). Wash Face and genitals (private parts)  with your normal soap.  ? ?Wash thoroughly, paying special attention to the area where your surgery will be performed. ? ?Thoroughly rinse your body with warm water from the neck down. ? ?DO NOT shower/wash with your normal soap after using and rinsing off the CHG Soap. ? ?Pat yourself dry with a CLEAN TOWEL. ? ?Wear CLEAN PAJAMAS to bed the night before surgery ? ?Place CLEAN SHEETS on your bed the night before your surgery ? ?DO NOT SLEEP WITH PETS. ? ? ?Day of Surgery: ? ?Take a shower with CHG soap. ?Wear Clean/Comfortable clothing the morning of surgery ?Do not apply any deodorants/lotions.   ?Remember to brush your teeth WITH YOUR REGULAR TOOTHPASTE. ? ? ? ?COVID testing ? ?If you are going to stay overnight or be admitted after your procedure/surgery and require a pre-op COVID test, please follow these instructions after your COVID test  ? ?You are not required to quarantine however you are required to wear a well-fitting mask when you are out and around people not in your household.  If your mask becomes wet or soiled, replace with a new one. ? ?Wash your hands often with soap and water for 20 seconds or clean your hands with an alcohol-based hand sanitizer that contains at least 60% alcohol. ? ?Do not share personal items. ? ?Notify your provider: ?if you are in close contact with someone who has COVID  ?or if you develop a fever of 100.4 or greater, sneezing, cough, sore throat, shortness of breath or body aches. ? ?  ?Please read over the following fact sheets that you were given.   ?

## 2021-04-29 NOTE — Progress Notes (Addendum)
PCP - none ?Cardiologist - none ?Rheumatologist-James Amil Amen MD ? ?PPM/ICD - denies ?Device Orders -  ?Rep Notified -  ? ?Chest x-ray - n/a ?EKG - 12/23/20 ?Stress Test - none ?ECHO - none ?Cardiac Cath - none ? ?Sleep Study - no ?CPAP -  ? ?Fasting Blood Sugar - n/a ?Checks Blood Sugar _____ times a day ? ?Blood Thinner Instructions:n/a ?Aspirin Instructions:n/a ? ?ERAS Protcol -no ?PRE-SURGERY Ensure or G2-  ? ?COVID TEST- 04/29/21 ? ? ?Anesthesia review: yes-history of difficult intubation ? ?Patient denies shortness of breath, fever, cough and chest pain at PAT appointment ? ? ?All instructions explained to the patient, with a verbal understanding of the material. Patient agrees to go over the instructions while at home for a better understanding. Patient also instructed to wear a mask while out in public after being tested for COVID-19. The opportunity to ask questions was provided. ?  ?

## 2021-04-30 NOTE — Anesthesia Preprocedure Evaluation (Addendum)
Anesthesia Evaluation  ?Patient identified by MRN, date of birth, ID band ?Patient awake ? ? ? ?Reviewed: ?Allergy & Precautions, NPO status , Patient's Chart, lab work & pertinent test results ? ?History of Anesthesia Complications ?(+) DIFFICULT AIRWAY and history of anesthetic complications ? ?Airway ?Mallampati: III ? ?TM Distance: >3 FB ?Neck ROM: Full ? ? ?Comment: Long tongue Dental ? ? ?Prominent cap between central incisors:   ?Pulmonary ?neg pulmonary ROS,  ?  ?Pulmonary exam normal ?breath sounds clear to auscultation ? ? ? ? ? ? Cardiovascular ?Exercise Tolerance: Good ?negative cardio ROS ?Normal cardiovascular exam ?Rhythm:Regular Rate:Normal ? ? ?  ?Neuro/Psych ?negative neurological ROS ? negative psych ROS  ? GI/Hepatic ?negative GI ROS, Neg liver ROS,   ?Endo/Other  ?negative endocrine ROS ? Renal/GU ?negative Renal ROS  ?negative genitourinary ?  ?Musculoskeletal ?negative musculoskeletal ROS ?(+)  ? Abdominal ?  ?Peds ?negative pediatric ROS ?(+)  Hematology ? ?(+) Blood dyscrasia, anemia ,   ?Anesthesia Other Findings ?Lupus ? Reproductive/Obstetrics ?negative OB ROS ? ?  ? ? ? ? ? ? ? ? ? ? ? ? ? ?  ?  ? ? ? ? ? ? ?Anesthesia Physical ?Anesthesia Plan ? ?ASA: 3 ? ?Anesthesia Plan: General  ? ?Post-op Pain Management: Tylenol PO (pre-op)*  ? ?Induction: Intravenous ? ?PONV Risk Score and Plan: 3 and Midazolam, Treatment may vary due to age or medical condition, Scopolamine patch - Pre-op, Dexamethasone and Ondansetron ? ?Airway Management Planned: Oral ETT and Video Laryngoscope Planned ? ?Additional Equipment: None ? ?Intra-op Plan:  ? ?Post-operative Plan: Extubation in OR ? ?Informed Consent: I have reviewed the patients History and Physical, chart, labs and discussed the procedure including the risks, benefits and alternatives for the proposed anesthesia with the patient or authorized representative who has indicated his/her understanding and acceptance.   ? ? ? ?Dental advisory given ? ?Plan Discussed with: Anesthesiologist, Surgeon and CRNA ? ?Anesthesia Plan Comments: (Glidescope for intubation. GETA. 2 PIV. Will setup PRBCs given anemia. Norton Blizzard, MD  ? ? ?History of difficult intubation due to anterior larynx, large tongue, limited neck range of motion.  GlideScope used successfully for intubation 11/10/2015. ? ?CBC 04/26/2021 in Care Everywhere reviewed, hemoglobin 8.9 consistent with history of anemia. ? ?)  ? ? ? ?Anesthesia Quick Evaluation ? ?

## 2021-05-03 ENCOUNTER — Observation Stay (HOSPITAL_COMMUNITY): Payer: BC Managed Care – PPO | Admitting: Physician Assistant

## 2021-05-03 ENCOUNTER — Other Ambulatory Visit: Payer: Self-pay

## 2021-05-03 ENCOUNTER — Encounter (HOSPITAL_COMMUNITY)
Admission: RE | Disposition: A | Discharge status: Home / Self Care | Payer: Self-pay | Source: Home / Self Care | Attending: Obstetrics and Gynecology

## 2021-05-03 ENCOUNTER — Encounter (HOSPITAL_COMMUNITY): Payer: Self-pay | Admitting: Obstetrics and Gynecology

## 2021-05-03 ENCOUNTER — Inpatient Hospital Stay (HOSPITAL_COMMUNITY)
Admission: RE | Admit: 2021-05-03 | Discharge: 2021-05-04 | DRG: 743 | Disposition: A | Discharge status: Home / Self Care | Payer: BC Managed Care – PPO | Attending: Obstetrics and Gynecology | Admitting: Obstetrics and Gynecology

## 2021-05-03 DIAGNOSIS — N920 Excessive and frequent menstruation with regular cycle: Secondary | ICD-10-CM | POA: Diagnosis present

## 2021-05-03 DIAGNOSIS — D259 Leiomyoma of uterus, unspecified: Principal | ICD-10-CM | POA: Diagnosis present

## 2021-05-03 DIAGNOSIS — Z9889 Other specified postprocedural states: Secondary | ICD-10-CM

## 2021-05-03 DIAGNOSIS — D630 Anemia in neoplastic disease: Secondary | ICD-10-CM | POA: Diagnosis not present

## 2021-05-03 LAB — POCT PREGNANCY, URINE: Preg Test, Ur: NEGATIVE

## 2021-05-03 LAB — PREPARE RBC (CROSSMATCH)

## 2021-05-03 SURGERY — MYOMECTOMY, ABDOMINAL APPROACH
Anesthesia: General | Site: Abdomen

## 2021-05-03 MED ORDER — ACETAMINOPHEN 500 MG PO TABS
1000.0000 mg | ORAL_TABLET | Freq: Four times a day (QID) | ORAL | Status: DC
  Administered 2021-05-03 – 2021-05-04 (×5): 1000 mg via ORAL
  Filled 2021-05-03 (×5): qty 2

## 2021-05-03 MED ORDER — KETOROLAC TROMETHAMINE 30 MG/ML IJ SOLN
30.0000 mg | Freq: Four times a day (QID) | INTRAMUSCULAR | Status: AC
  Administered 2021-05-03 – 2021-05-04 (×4): 30 mg via INTRAVENOUS
  Filled 2021-05-03 (×4): qty 1

## 2021-05-03 MED ORDER — LACTATED RINGERS IV SOLN: INTRAVENOUS | Status: DC

## 2021-05-03 MED ORDER — CHLORHEXIDINE GLUCONATE 0.12 % MT SOLN
15.0000 mL | Freq: Once | OROMUCOSAL | Status: AC
  Administered 2021-05-03: 15 mL via OROMUCOSAL
  Filled 2021-05-03: qty 15

## 2021-05-03 MED ORDER — LIDOCAINE 2% (20 MG/ML) 5 ML SYRINGE
INTRAMUSCULAR | Status: DC | PRN
  Administered 2021-05-03: 80 mg via INTRAVENOUS

## 2021-05-03 MED ORDER — FENTANYL CITRATE (PF) 250 MCG/5ML IJ SOLN
INTRAMUSCULAR | Status: DC | PRN
  Administered 2021-05-03: 50 ug via INTRAVENOUS
  Administered 2021-05-03: 100 ug via INTRAVENOUS
  Administered 2021-05-03 (×2): 50 ug via INTRAVENOUS

## 2021-05-03 MED ORDER — SODIUM CHLORIDE 0.9% FLUSH: 9.0000 mL | INTRAVENOUS | Status: DC | PRN

## 2021-05-03 MED ORDER — OXYCODONE HCL 5 MG PO TABS
5.0000 mg | ORAL_TABLET | ORAL | Status: DC | PRN
  Administered 2021-05-04 (×2): 5 mg via ORAL
  Filled 2021-05-03: qty 1
  Filled 2021-05-03: qty 2

## 2021-05-03 MED ORDER — HYDROMORPHONE 1 MG/ML IV SOLN
INTRAVENOUS | Status: DC
  Administered 2021-05-04: 0.2 mg via INTRAVENOUS
  Administered 2021-05-04: 0.4 mg via INTRAVENOUS
  Administered 2021-05-04: 0.6 mg via INTRAVENOUS
  Filled 2021-05-03: qty 30

## 2021-05-03 MED ORDER — NALOXONE HCL 0.4 MG/ML IJ SOLN: 0.4000 mg | INTRAMUSCULAR | Status: DC | PRN

## 2021-05-03 MED ORDER — ONDANSETRON HCL 4 MG/2ML IJ SOLN
INTRAMUSCULAR | Status: DC | PRN
  Administered 2021-05-03: 4 mg via INTRAVENOUS

## 2021-05-03 MED ORDER — ONDANSETRON HCL 4 MG/2ML IJ SOLN: 4.0000 mg | Freq: Once | INTRAMUSCULAR | Status: DC | PRN

## 2021-05-03 MED ORDER — DIPHENHYDRAMINE HCL 50 MG/ML IJ SOLN: 12.5000 mg | Freq: Four times a day (QID) | INTRAMUSCULAR | Status: DC | PRN

## 2021-05-03 MED ORDER — DEXMEDETOMIDINE (PRECEDEX) IN NS 20 MCG/5ML (4 MCG/ML) IV SYRINGE
PREFILLED_SYRINGE | INTRAVENOUS | Status: DC | PRN
  Administered 2021-05-03: 8 ug via INTRAVENOUS

## 2021-05-03 MED ORDER — ONDANSETRON HCL 4 MG/2ML IJ SOLN: 4.0000 mg | Freq: Four times a day (QID) | INTRAMUSCULAR | Status: DC | PRN

## 2021-05-03 MED ORDER — ORAL CARE MOUTH RINSE: 15.0000 mL | Freq: Once | OROMUCOSAL | Status: AC

## 2021-05-03 MED ORDER — KETOROLAC TROMETHAMINE 30 MG/ML IJ SOLN: 30.0000 mg | Freq: Once | INTRAMUSCULAR | Status: DC

## 2021-05-03 MED ORDER — OXYCODONE HCL 5 MG PO TABS: 5.0000 mg | ORAL_TABLET | Freq: Once | ORAL | Status: DC | PRN

## 2021-05-03 MED ORDER — MIDAZOLAM HCL 2 MG/2ML IJ SOLN
INTRAMUSCULAR | Status: DC | PRN
  Administered 2021-05-03: 2 mg via INTRAVENOUS

## 2021-05-03 MED ORDER — ROCURONIUM BROMIDE 10 MG/ML (PF) SYRINGE
PREFILLED_SYRINGE | INTRAVENOUS | Status: DC | PRN
  Administered 2021-05-03: 80 mg via INTRAVENOUS

## 2021-05-03 MED ORDER — AMISULPRIDE (ANTIEMETIC) 5 MG/2ML IV SOLN: 10.0000 mg | Freq: Once | INTRAVENOUS | Status: DC | PRN

## 2021-05-03 MED ORDER — DIPHENHYDRAMINE HCL 12.5 MG/5ML PO ELIX: 12.5000 mg | ORAL_SOLUTION | Freq: Four times a day (QID) | ORAL | Status: DC | PRN

## 2021-05-03 MED ORDER — OXYCODONE HCL 5 MG/5ML PO SOLN: 5.0000 mg | Freq: Once | ORAL | Status: DC | PRN

## 2021-05-03 MED ORDER — HYDROMORPHONE HCL 1 MG/ML IJ SOLN: 0.2500 mg | INTRAMUSCULAR | Status: DC | PRN

## 2021-05-03 MED ORDER — CEFAZOLIN SODIUM-DEXTROSE 2-4 GM/100ML-% IV SOLN
2.0000 g | INTRAVENOUS | Status: AC
  Administered 2021-05-03: 2 g via INTRAVENOUS
  Filled 2021-05-03: qty 100

## 2021-05-03 MED ORDER — PROPOFOL 10 MG/ML IV BOLUS
INTRAVENOUS | Status: DC | PRN
  Administered 2021-05-03: 150 mg via INTRAVENOUS

## 2021-05-03 MED ORDER — FENTANYL CITRATE (PF) 100 MCG/2ML IJ SOLN
25.0000 ug | INTRAMUSCULAR | Status: DC | PRN
  Administered 2021-05-03 (×2): 50 ug via INTRAVENOUS

## 2021-05-03 MED ORDER — POVIDONE-IODINE 10 % EX SWAB
2.0000 "application " | Freq: Once | CUTANEOUS | Status: AC
  Administered 2021-05-03: 2 via TOPICAL

## 2021-05-03 MED ORDER — IBUPROFEN 600 MG PO TABS
600.0000 mg | ORAL_TABLET | Freq: Four times a day (QID) | ORAL | Status: DC
  Administered 2021-05-04: 600 mg via ORAL
  Filled 2021-05-03: qty 1

## 2021-05-03 MED ORDER — DEXAMETHASONE SODIUM PHOSPHATE 10 MG/ML IJ SOLN
INTRAMUSCULAR | Status: DC | PRN
  Administered 2021-05-03: 10 mg via INTRAVENOUS

## 2021-05-03 MED ORDER — 0.9 % SODIUM CHLORIDE (POUR BTL) OPTIME
TOPICAL | Status: DC | PRN
  Administered 2021-05-03: 1000 mL

## 2021-05-03 MED ORDER — VASOPRESSIN 20 UNIT/ML IV SOLN
INTRAVENOUS | Status: DC | PRN
  Administered 2021-05-03: 18 mL via INTRAMUSCULAR

## 2021-05-03 MED ORDER — SUGAMMADEX SODIUM 200 MG/2ML IV SOLN
INTRAVENOUS | Status: DC | PRN
Start: 2021-05-03 — End: 2021-05-03
  Administered 2021-05-03 (×2): 200 mg via INTRAVENOUS

## 2021-05-03 SURGICAL SUPPLY — 51 items
BAG COUNTER SPONGE SURGICOUNT (BAG) ×2 IMPLANT
BAG SPNG CNTER NS LX DISP (BAG) ×1
BARRIER ADHS 3X4 INTERCEED (GAUZE/BANDAGES/DRESSINGS) ×2 IMPLANT
BRR ADH 4X3 ABS CNTRL BYND (GAUZE/BANDAGES/DRESSINGS) ×2
CANISTER SUCT 3000ML PPV (MISCELLANEOUS) ×2 IMPLANT
DECANTER SPIKE VIAL GLASS SM (MISCELLANEOUS) ×2 IMPLANT
DRAIN PENROSE 18X1/4 LTX STRL (DRAIN) ×1 IMPLANT
DRAPE CESAREAN BIRTH W POUCH (DRAPES) ×2 IMPLANT
DRSG OPSITE POSTOP 4X10 (GAUZE/BANDAGES/DRESSINGS) ×3 IMPLANT
DURAPREP 26ML APPLICATOR (WOUND CARE) ×2 IMPLANT
ELECT CAUTERY BLADE 6.4 (BLADE) IMPLANT
ELECT NDL TIP 2.8 STRL (NEEDLE) IMPLANT
ELECT NEEDLE TIP 2.8 STRL (NEEDLE) IMPLANT
GLOVE SURG ENC MOIS LTX SZ7.5 (GLOVE) ×2 IMPLANT
GLOVE SURG UNDER POLY LF SZ7 (GLOVE) ×4 IMPLANT
GLOVE SURG UNDER POLY LF SZ7.5 (GLOVE) ×2 IMPLANT
GOWN STRL REUS W/ TWL LRG LVL3 (GOWN DISPOSABLE) ×3 IMPLANT
GOWN STRL REUS W/TWL LRG LVL3 (GOWN DISPOSABLE) ×6
HEMOSTAT SURGICEL 2X14 (HEMOSTASIS) IMPLANT
KIT TURNOVER KIT B (KITS) ×2 IMPLANT
NEEDLE HYPO 22GX1.5 SAFETY (NEEDLE) ×2 IMPLANT
NS IRRIG 1000ML POUR BTL (IV SOLUTION) ×2 IMPLANT
PACK ABDOMINAL GYN (CUSTOM PROCEDURE TRAY) ×2 IMPLANT
PAD ARMBOARD 7.5X6 YLW CONV (MISCELLANEOUS) ×2 IMPLANT
PAD OB MATERNITY 4.3X12.25 (PERSONAL CARE ITEMS) ×2 IMPLANT
PENCIL SMOKE EVACUATOR (MISCELLANEOUS) ×2 IMPLANT
RTRCTR C-SECT PINK 25CM LRG (MISCELLANEOUS) ×1 IMPLANT
SPONGE SURGIFOAM ABS GEL 12-7 (HEMOSTASIS) IMPLANT
SPONGE T-LAP 18X18 ~~LOC~~+RFID (SPONGE) ×5 IMPLANT
STAPLER VISISTAT 35W (STAPLE) IMPLANT
STRIP CLOSURE SKIN 1/2X4 (GAUZE/BANDAGES/DRESSINGS) ×2 IMPLANT
SUT CHROMIC 2 0 CT 1 (SUTURE) ×2 IMPLANT
SUT MNCRL AB 3-0 PS2 27 (SUTURE) ×1 IMPLANT
SUT PDS AB 1 CTX 36 (SUTURE) IMPLANT
SUT PLAIN 2 0 XLH (SUTURE) IMPLANT
SUT VIC AB 0 CT1 18XCR BRD8 (SUTURE) IMPLANT
SUT VIC AB 0 CT1 27 (SUTURE) ×18
SUT VIC AB 0 CT1 27XBRD ANBCTR (SUTURE) ×4 IMPLANT
SUT VIC AB 0 CT1 8-18 (SUTURE)
SUT VIC AB 0 CTX 36 (SUTURE)
SUT VIC AB 0 CTX36XBRD ANBCTRL (SUTURE) IMPLANT
SUT VIC AB 2-0 CT1 27 (SUTURE)
SUT VIC AB 2-0 CT1 TAPERPNT 27 (SUTURE) IMPLANT
SUT VIC AB 2-0 SH 27 (SUTURE)
SUT VIC AB 2-0 SH 27XBRD (SUTURE) IMPLANT
SUT VIC AB 3-0 SH 27 (SUTURE) ×10
SUT VIC AB 3-0 SH 27X BRD (SUTURE) ×2 IMPLANT
SWABSTK COMLB BENZOIN TINCTURE (MISCELLANEOUS) ×1 IMPLANT
SYR CONTROL 10ML LL (SYRINGE) ×2 IMPLANT
TOWEL GREEN STERILE FF (TOWEL DISPOSABLE) ×4 IMPLANT
TRAY FOLEY W/BAG SLVR 14FR (SET/KITS/TRAYS/PACK) ×2 IMPLANT

## 2021-05-03 NOTE — H&P (Signed)
Dominique Jones is an 36 y.o. female. Pt known to me with symptomatic fibroids with pain and bleeding and h/o myomectomy and ultimately bilateral salpingectomies but desires uterine preservation for fertility. ? ?Pertinent Gynecological History: ? ?OB History: G1, P0010  ? ?Menstrual History: ? ?Patient's last menstrual period was 04/22/2021 (approximate). ?  ? ?Past Medical History:  ?Diagnosis Date  ? Anemia   ? Complication of anesthesia   ? difficult intubation  ? Ectopic pregnancy   ? Glaucoma   ? follows up with opthalmologist once a year for pressure check  ? Lupus ([place])   ? ? ?Past Surgical History:  ?Procedure Laterality Date  ? LAPAROSCOPY N/A 12/26/2012  ? Procedure: LAPAROSCOPY OPERATIVE REMOVAL OF ECTOPIC PREGNANCY;  Surgeon: Governor Specking, MD;  Location: [facility];  Service: Gynecology;  Laterality: N/A;  Attempted  ? LAPAROTOMY N/A 12/26/2012  ? Procedure: LAPAROTOMY;  Surgeon: Governor Specking, MD;  Location: [facility];  Service: Gynecology;  Laterality: N/A;  ? MYOMECTOMY N/A 11/10/2015  ? Procedure: MYOMECTOMY, Left Salpingectomy, Excision of Incisional Mass;  Surgeon: Eldred Manges, MD;  Location: [facility];  Service: Gynecology;  Laterality: N/A;  ? OPERATIVE ULTRASOUND Right 12/14/2012  ? Procedure: OPERATIVE ULTRASOUND GUIDED INJECTION OF POTASSIUM CHLORIDE 2.5ML;  Surgeon: Governor Specking, MD;  Location: [facility];  Service: Gynecology;  Laterality: Right;  ? REFRACTIVE SURGERY    ? UNILATERAL SALPINGECTOMY Right 12/26/2012  ? Procedure: UNILATERAL SALPINGECTOMY;  Surgeon: Governor Specking, MD;  Location: [facility];  Service: Gynecology;  Laterality: Right;  ? ? ?History reviewed. No pertinent family history. ? ?Social History:  reports that she has never smoked. She has never used smokeless tobacco. She reports current alcohol use. She reports that she does not use drugs. ? ?Allergies: No Known Allergies ? ?Medications Prior to Admission  ?Medication Sig Dispense Refill Last Dose  ?  acetaminophen (TYLENOL) 500 MG tablet Take 1,000 mg by mouth every 6 (six) hours as needed for moderate pain.   Past Week  ? diphenhydrAMINE (BENADRYL) 25 MG tablet Take 25 mg by mouth every 6 (six) hours as needed for allergies.   More than a month  ? hydroxychloroquine (PLAQUENIL) 200 MG tablet Take 400 mg by mouth daily.   More than a month  ? ? ?Review of Systems ?Denies f/c/n/v/d ? ?Blood pressure (!) 146/94, pulse 84, temperature 98.3 ?F (36.8 ?C), temperature source Oral, resp. rate 18, height '5\' 6"'$  (1.676 m), weight 88.5 kg, last menstrual period 04/22/2021, SpO2 100 %. ?Physical Exam ? ?Lungs cta ?CV RRR ?Abd soft, nt ?Ext no calf tenderness ? ?Results for orders placed or performed during the hospital encounter of 05/03/21 (from the past 24 hour(s))  ?Pregnancy, urine POC     Status: None  ? Collection Time: 05/03/21 12:07 PM  ?Result Value Ref Range  ? Preg Test, Ur NEGATIVE NEGATIVE  ?Prepare RBC (crossmatch)     Status: None  ? Collection Time: 05/03/21 12:28 PM  ?Result Value Ref Range  ? Order Confirmation    ?  ORDER PROCESSED BY BLOOD BANK ?Performed at [facility], [place] [address] [place], [place] [address] ?  ? ? ?No results found. ? ?Assessment/Plan: ?P0 with symptomatic fibroids presenting for abdominal myomectomy.  Risks benefits and alternatives reviewed with the patient including but not limited to bleeding infection and injury.  Questions answered and consent signed and witnessed. ? ?Delice Lesch ?05/03/2021, 1:04 PM ? ?

## 2021-05-03 NOTE — Op Note (Addendum)
Preop Diagnosis: SYMPTOMATIC FIBROIDS  ? ?Postop Diagnosis: SYMPTOMATIC FIBROIDS  ? ?Procedure: ABDOMINAL MYOMECTOMY ?APPLICATION OF CELL SAVER  ? ?Anesthesia: General  ? ?Anesthesiologist: Merlinda Frederick, MD  ? ?Attending: Everett Graff, MD  ? ?Assistant: ?Waymon Amato, MD ? ?Findings: ?13 fibroids largest about 8cm. 254g ? ?Pathology: ?Fibroids ? ?Fluids: ?1500 cc ? ?UOP: ?400 cc ? ?EBL: ?650 cc ?Cell Saver 287 cc transfused ? ?Complications: ?None ? ?Procedure: ?The patient was taken to the operating room after the risks, benefits and alternatives were discussed with the patient. The patient verbalized understanding and consent signed and witnessed. The patient was placed under general anesthesia with an LMA per anesthesiologist and prepped and draped in the normal sterile fashion.  Time Out was performed per protocol.  A pfannenstiel skin incision was made and carried down to the underlying fascia.  The fascial incision was extended bilaterally with the Mayo scissors.  The inferior aspect of the fascial incision was grasped with Kocher clamps and the fascia excised from the rectus muscle.  The same was done on the superior aspect of the fascial incision.  The muscle was separated in the midline and the peritoneum entered bluntly and extended manually.  A self-retaining Alexis retractor placed.  Small adhesions excised from wall.  A towel clamp was placed on the fundal fibroid and the uterus elevated up to the incision.  Vasopressin at a concentration of 20 units of vasopressin in 50cc of normal saline was injected into the fundal fibroid and the fibroid shelled out using a hemostat and bovie for cautery.  The same was done for the remaining fibroids.  All beds were repaired with 0 vicryl and the serosa was reapproximated with 3-0 vicryl.  Copious irrigation was performed.  Intercede was placed on all incisions.  One incision was at the fundus.  Two were on the posterior wall of the uterus and one was on the  anterior lower uterine segment.  The endometrial cavity was not entered.  The fascia was repaired with 0 vicryl.  The subcutaneous tissue was irrigated and made hemostatic with the bovie.  The skin was reapproximated with 3-0 monocryl and steristrips with benzoin applied.  Honeycomb dressing placed. ?

## 2021-05-03 NOTE — Anesthesia Procedure Notes (Signed)
Procedure Name: Intubation ?Date/Time: 05/03/2021 1:33 PM ?Performed by: Janace Litten, CRNA ?Pre-anesthesia Checklist: Patient identified, Emergency Drugs available, Suction available and Patient being monitored ?Patient Re-evaluated:Patient Re-evaluated prior to induction ?Oxygen Delivery Method: Circle System Utilized ?Preoxygenation: Pre-oxygenation with 100% oxygen ?Induction Type: IV induction ?Ventilation: Mask ventilation without difficulty ?Laryngoscope Size: Glidescope and 3 ?Grade View: Grade I ?Tube type: Oral ?Tube size: 7.0 mm ?Number of attempts: 2 ?Airway Equipment and Method: Stylet and Oral airway ?Placement Confirmation: ETT inserted through vocal cords under direct vision, positive ETCO2 and breath sounds checked- equal and bilateral ?Secured at: 22 cm ?Tube secured with: Tape ?Dental Injury: Teeth and Oropharynx as per pre-operative assessment  ?Difficulty Due To: Difficulty was anticipated, Difficult Airway- due to anterior larynx and Difficult Airway- due to large tongue ?Future Recommendations: Recommend- induction with short-acting agent, and alternative techniques readily available ?Comments: DLx1 with MAC 3 blade - grade III view, did not attempt intubation; VLx2 with Glidescope 3 - grade I view, very anterior airway, intubation with oral 7.0 ETT. Recommend glidescope use for future ETT anesthetics. ? ? ? ? ?

## 2021-05-03 NOTE — Transfer of Care (Signed)
Immediate Anesthesia Transfer of Care Note ? ?Patient: Dominique Jones ? ?Procedure(s) Performed: ABDOMINAL MYOMECTOMY (Abdomen) ?APPLICATION OF CELL SAVER (Abdomen) ? ?Patient Location: PACU ? ?Anesthesia Type:General ? ?Level of Consciousness: drowsy and patient cooperative ? ?Airway & Oxygen Therapy: Patient Spontanous Breathing ? ?Post-op Assessment: Report given to RN and Post -op Vital signs reviewed and stable ? ?Post vital signs: Reviewed and stable ? ?Last Vitals:  ?Vitals Value Taken Time  ?BP 142/89 05/03/21 1555  ?Temp    ?Pulse 70 05/03/21 1558  ?Resp 12 05/03/21 1558  ?SpO2 98 % 05/03/21 1558  ?Vitals shown include unvalidated device data. ? ?Last Pain:  ?Vitals:  ? 05/03/21 1202  ?TempSrc:   ?PainSc: 0-No pain  ?   ? ?  ? ?Complications: No notable events documented. ?

## 2021-05-03 NOTE — Plan of Care (Signed)
?  Problem: Education: ?Goal: Knowledge of General Education information will improve ?Description: Including pain rating scale, medication(s)/side effects and non-pharmacologic comfort measures ?Outcome: Completed/Met ?  ?

## 2021-05-04 ENCOUNTER — Encounter (HOSPITAL_COMMUNITY): Payer: Self-pay | Admitting: Obstetrics and Gynecology

## 2021-05-04 LAB — BASIC METABOLIC PANEL
Anion gap: 7 (ref 5–15)
BUN: 11 mg/dL (ref 6–20)
CO2: 24 mmol/L (ref 22–32)
Calcium: 8.2 mg/dL — ABNORMAL LOW (ref 8.9–10.3)
Chloride: 104 mmol/L (ref 98–111)
Creatinine, Ser: 0.78 mg/dL (ref 0.44–1.00)
GFR, Estimated: >60 mL/min (ref >60)
Glucose, Bld: 139 mg/dL — ABNORMAL HIGH (ref 70–99)
Potassium: 4.2 mmol/L (ref 3.5–5.1)
Sodium: 135 mmol/L (ref 135–145)

## 2021-05-04 LAB — CBC
HCT: 25.3 % — ABNORMAL LOW (ref 36.0–46.0)
Hemoglobin: 7.8 g/dL — ABNORMAL LOW (ref 12.0–15.0)
MCH: 21.6 pg — ABNORMAL LOW (ref 26.0–34.0)
MCHC: 30.8 g/dL (ref 30.0–36.0)
MCV: 70.1 fL — ABNORMAL LOW (ref 80.0–100.0)
Platelets: 401 10*3/uL — ABNORMAL HIGH (ref 150–400)
RBC: 3.61 MIL/uL — ABNORMAL LOW (ref 3.87–5.11)
RDW: 18.2 % — ABNORMAL HIGH (ref 11.5–15.5)
WBC: 14.1 10*3/uL — ABNORMAL HIGH (ref 4.0–10.5)
nRBC: 0 % (ref 0.0–0.2)

## 2021-05-04 MED ORDER — IBUPROFEN 600 MG PO TABS: 600.0000 mg | ORAL_TABLET | Freq: Four times a day (QID) | ORAL | 1 refills | Status: AC | PRN

## 2021-05-04 MED ORDER — SIMETHICONE 80 MG PO CHEW
80.0000 mg | CHEWABLE_TABLET | Freq: Three times a day (TID) | ORAL | Status: DC | PRN
  Administered 2021-05-04: 80 mg via ORAL
  Filled 2021-05-04: qty 1

## 2021-05-04 MED ORDER — OXYCODONE-ACETAMINOPHEN 5-325 MG PO TABS: 1.0000 | ORAL_TABLET | Freq: Four times a day (QID) | ORAL | 0 refills | Status: DC | PRN

## 2021-05-04 MED ORDER — SODIUM CHLORIDE 0.9 % IV SOLN
200.0000 mg | Freq: Once | INTRAVENOUS | Status: AC
  Administered 2021-05-04: 200 mg via INTRAVENOUS
  Filled 2021-05-04: qty 10

## 2021-05-04 NOTE — Progress Notes (Signed)
RN discussed discharge information with patient and her mother including, medication, signs of infection, activity post operative, and follow up appointments. Patient confirmed understanding of material. RN discharged Patient. ?Danie Binder, RN  ?

## 2021-05-04 NOTE — Progress Notes (Signed)
Patient ambulated in hall around unit x1 independently. Patient stated no dizziness or pain at this time. ?

## 2021-05-04 NOTE — Discharge Summary (Signed)
Physician Discharge Summary  ?Patient ID: ?Dominique Jones ?MRN: [id number] ?DOB/AGE: 35-11-1985 35 y.o. ? ?Admit date: 05/03/2021 ?Discharge date: 05/04/2021 ? ?Admission Diagnoses: ?Symptomatic Fibroids ? ?Discharge Diagnoses:  ?Principal Problem: ?  Excessive and frequent menstruation ?Active Problems: ?  Status post myomectomy ? ? ?Discharged Condition: good ? ?Hospital Course: POD #1 pt doing well.  Voiding spontaneously tolerating regular diet ambulating without difficulty minimal bleeding and dressing c/d/I. ? ?Consults: None ? ?Significant Diagnostic Studies: labs: Hgb 7.8 ? ?Treatments: IV Venofer and hydration and pain mgmt ? ?Discharge Exam: ?Blood pressure 120/69, pulse 72, temperature 98.2 ?F (36.8 ?C), temperature source Oral, resp. rate 18, height '5\' 6"'$  (1.676 m), weight 88.5 kg, last menstrual period 04/22/2021, SpO2 100 %. ?General appearance: alert and no distress ?Resp: clear to auscultation bilaterally ?Cardio: regular rate and rhythm ?GI: soft, app tender; bowel sounds normal; no masses,  no organomegaly ?Extremities: Homans sign is negative, no sign of DVT and no calf tenderness ?Incision/Wound: ?Dressing c/d/I ?Minimal vaginal bleeding ? ?Disposition: Discharge disposition: 01-Home or Self Care ? ? ? ? ? ? ? ?Allergies as of 05/04/2021   ?No Known Allergies ?  ? ?  ?Medication List  ?  ? ?STOP taking these medications   ? ?acetaminophen 500 MG tablet ?Commonly known as: TYLENOL ?  ? ?  ? ?TAKE these medications   ? ?diphenhydrAMINE 25 MG tablet ?Commonly known as: BENADRYL ?Take 25 mg by mouth every 6 (six) hours as needed for allergies. ?  ?hydroxychloroquine 200 MG tablet ?Commonly known as: PLAQUENIL ?Take 400 mg by mouth daily. ?  ?ibuprofen 600 MG tablet ?Commonly known as: ADVIL ?Take 1 tablet (600 mg total) by mouth every 6 (six) hours as needed. ?  ?oxyCODONE-acetaminophen 5-325 MG tablet ?Commonly known as: Percocet ?Take 1-2 tablets by mouth every 6 (six) hours as needed for severe pain  or moderate pain. ?  ? ?  ? ? Follow-up Information   ? ? Everett Graff, MD Follow up.   ?Specialty: Obstetrics and Gynecology ?Contact information: ?[address] ?[address] ?[place] [address] ?[phone] ? ? ?  ?  ? ?  ?  ? ?  ? ? ?Signed: ?Delice Lesch ?05/04/2021, 6:52 PM ? ? ?

## 2021-05-04 NOTE — Anesthesia Postprocedure Evaluation (Signed)
Anesthesia Post Note ? ?Patient: Dominique Jones ? ?Procedure(s) Performed: ABDOMINAL MYOMECTOMY (Abdomen) ?APPLICATION OF CELL SAVER (Abdomen) ? ?  ? ?Patient location during evaluation: PACU ?Anesthesia Type: General ?Level of consciousness: sedated ?Pain management: pain level controlled ?Vital Signs Assessment: post-procedure vital signs reviewed and stable ?Respiratory status: spontaneous breathing and respiratory function stable ?Cardiovascular status: stable ?Postop Assessment: no apparent nausea or vomiting ?Anesthetic complications: no ? ? ?No notable events documented. ? ?Last Vitals:  ?Vitals:  ? 05/04/21 1232 05/04/21 1524  ?BP: 120/63 120/69  ?Pulse: 74 72  ?Resp: 17 18  ?Temp: 36.8 ?C 36.8 ?C  ?SpO2: 100% 100%  ?  ?Last Pain:  ?Vitals:  ? 05/04/21 1854  ?TempSrc:   ?PainSc: 5   ? ?Pain Goal: Patients Stated Pain Goal: 3 (05/04/21 0900) ? ?  ?  ?  ?  ?  ?  ?  ? ?Merlinda Frederick ? ? ? ? ?

## 2021-05-04 NOTE — Progress Notes (Signed)
Patient ambulates to bathroom with minimal assist. Patient complains of mild dizziness when ambulating. Standby assist required. MD aware. ?

## 2021-05-05 LAB — SURGICAL PATHOLOGY

## 2021-05-05 MED FILL — Heparin Sodium (Porcine) Inj 1000 Unit/ML: INTRAMUSCULAR | Qty: 30 | Status: AC

## 2021-05-05 MED FILL — Sodium Chloride IV Soln 0.9%: INTRAVENOUS | Qty: 1000 | Status: AC

## 2021-05-06 LAB — BPAM RBC
Blood Product Expiration Date: 202303252359
Blood Product Expiration Date: 202303312359
ISSUE DATE / TIME: 202303131234
ISSUE DATE / TIME: 202303131234
Unit Type and Rh: 6200
Unit Type and Rh: 6200

## 2021-05-06 LAB — TYPE AND SCREEN
ABO/RH(D): A POS
Antibody Screen: NEGATIVE
Unit division: 0
Unit division: 0

## 2021-06-09 DIAGNOSIS — N898 Other specified noninflammatory disorders of vagina: Secondary | ICD-10-CM | POA: Diagnosis not present

## 2021-06-09 DIAGNOSIS — D649 Anemia, unspecified: Secondary | ICD-10-CM | POA: Diagnosis not present

## 2021-07-15 DIAGNOSIS — R112 Nausea with vomiting, unspecified: Secondary | ICD-10-CM | POA: Diagnosis not present

## 2021-07-15 DIAGNOSIS — N920 Excessive and frequent menstruation with regular cycle: Secondary | ICD-10-CM | POA: Diagnosis not present

## 2021-07-15 DIAGNOSIS — N939 Abnormal uterine and vaginal bleeding, unspecified: Secondary | ICD-10-CM | POA: Diagnosis not present

## 2021-07-15 DIAGNOSIS — R102 Pelvic and perineal pain: Secondary | ICD-10-CM | POA: Diagnosis not present

## 2021-08-19 DIAGNOSIS — D649 Anemia, unspecified: Secondary | ICD-10-CM | POA: Diagnosis not present

## 2021-09-13 ENCOUNTER — Inpatient Hospital Stay: Payer: BC Managed Care – PPO | Admitting: Hematology and Oncology

## 2021-09-13 ENCOUNTER — Inpatient Hospital Stay: Payer: BC Managed Care – PPO

## 2021-09-29 ENCOUNTER — Telehealth: Payer: Self-pay | Admitting: Physician Assistant

## 2021-09-29 ENCOUNTER — Inpatient Hospital Stay (HOSPITAL_BASED_OUTPATIENT_CLINIC_OR_DEPARTMENT_OTHER): Payer: BC Managed Care – PPO | Admitting: Hematology and Oncology

## 2021-09-29 ENCOUNTER — Encounter: Payer: Self-pay | Admitting: Hematology and Oncology

## 2021-09-29 ENCOUNTER — Other Ambulatory Visit: Payer: Self-pay

## 2021-09-29 ENCOUNTER — Other Ambulatory Visit: Payer: Self-pay | Admitting: Hematology and Oncology

## 2021-09-29 ENCOUNTER — Inpatient Hospital Stay: Payer: BC Managed Care – PPO | Attending: Hematology and Oncology

## 2021-09-29 VITALS — BP 148/102 | HR 84 | Temp 99.7°F | Resp 18 | Ht 66.0 in | Wt 191.4 lb

## 2021-09-29 DIAGNOSIS — D500 Iron deficiency anemia secondary to blood loss (chronic): Secondary | ICD-10-CM

## 2021-09-29 DIAGNOSIS — D509 Iron deficiency anemia, unspecified: Secondary | ICD-10-CM | POA: Diagnosis not present

## 2021-09-29 DIAGNOSIS — M329 Systemic lupus erythematosus, unspecified: Secondary | ICD-10-CM

## 2021-09-29 LAB — CBC WITH DIFFERENTIAL (CANCER CENTER ONLY)
Abs Immature Granulocytes: 0 10*3/uL (ref 0.00–0.07)
Basophils Absolute: 0 10*3/uL (ref 0.0–0.1)
Basophils Relative: 1 %
Eosinophils Absolute: 0.3 10*3/uL (ref 0.0–0.5)
Eosinophils Relative: 8 %
HCT: 35 % — ABNORMAL LOW (ref 36.0–46.0)
Hemoglobin: 11.4 g/dL — ABNORMAL LOW (ref 12.0–15.0)
Immature Granulocytes: 0 %
Lymphocytes Relative: 46 %
Lymphs Abs: 1.6 10*3/uL (ref 0.7–4.0)
MCH: 24 pg — ABNORMAL LOW (ref 26.0–34.0)
MCHC: 32.6 g/dL (ref 30.0–36.0)
MCV: 73.7 fL — ABNORMAL LOW (ref 80.0–100.0)
Monocytes Absolute: 0.5 10*3/uL (ref 0.1–1.0)
Monocytes Relative: 13 %
Neutro Abs: 1.1 10*3/uL — ABNORMAL LOW (ref 1.7–7.7)
Neutrophils Relative %: 32 %
Platelet Count: 330 10*3/uL (ref 150–400)
RBC: 4.75 MIL/uL (ref 3.87–5.11)
RDW: 19.4 % — ABNORMAL HIGH (ref 11.5–15.5)
WBC Count: 3.5 10*3/uL — ABNORMAL LOW (ref 4.0–10.5)
nRBC: 0 % (ref 0.0–0.2)

## 2021-09-29 LAB — CMP (CANCER CENTER ONLY)
ALT: 15 U/L (ref 0–44)
AST: 17 U/L (ref 15–41)
Albumin: 4.1 g/dL (ref 3.5–5.0)
Alkaline Phosphatase: 69 U/L (ref 38–126)
Anion gap: 7 (ref 5–15)
BUN: 13 mg/dL (ref 6–20)
CO2: 24 mmol/L (ref 22–32)
Calcium: 8.7 mg/dL — ABNORMAL LOW (ref 8.9–10.3)
Chloride: 108 mmol/L (ref 98–111)
Creatinine: 0.78 mg/dL (ref 0.44–1.00)
GFR, Estimated: >60 mL/min (ref >60)
Glucose, Bld: 108 mg/dL — ABNORMAL HIGH (ref 70–99)
Potassium: 3.9 mmol/L (ref 3.5–5.1)
Sodium: 139 mmol/L (ref 135–145)
Total Bilirubin: 0.3 mg/dL (ref 0.3–1.2)
Total Protein: 7.7 g/dL (ref 6.5–8.1)

## 2021-09-29 LAB — IRON AND IRON BINDING CAPACITY (CC-WL,HP ONLY)
Iron: 38 ug/dL (ref 28–170)
Saturation Ratios: 9 % — ABNORMAL LOW (ref 10.4–31.8)
TIBC: 445 ug/dL (ref 250–450)
UIBC: 407 ug/dL

## 2021-09-29 LAB — RETIC PANEL
Immature Retic Fract: 14.6 % (ref 2.3–15.9)
RBC.: 4.81 MIL/uL (ref 3.87–5.11)
Retic Count, Absolute: 49.1 10*3/uL (ref 19.0–186.0)
Retic Ct Pct: 1 % (ref 0.4–3.1)
Reticulocyte Hemoglobin: 26.7 pg — ABNORMAL LOW (ref >27.9)

## 2021-09-29 NOTE — Telephone Encounter (Signed)
Per 8/9 los called and spoke to pt about appointment  pt confirmed appointment

## 2021-09-29 NOTE — Progress Notes (Signed)
[place] [phone]   [phone]  PROGRESS NOTE  Patient Care Team: Patient, No Pcp Per as PCP - General (General Practice)  Hematological/Oncological History # Iron Deficiency Anemia in Setting of Lupus/GYN Bleeding 08/03/2020: WBC 8.7, Hgb 7.0, MCV 65, Plt 318. ESR 30, CRP 3. Vitamin B12 628, folate 9.0. 08/17/2020: establish care with Dr. Lorenso Courier 7/11-09/28/2020: IV iron sucrose 230m x 5 doses 10/19/2020: Hgb 9.4, MCV 79.8, Plt 313 01/11/2021: WBC 4.7, Hgb 7.7, MCV 67.5, Plt 501 01/26/2021: 1 dose of IV monoferric 10064m1/23/2023: WBC 5.1, Hgb 8.8, MCV 76.4, Plt 432  09/29/2021: White blood cell 3.5, hemoglobin 1.4, MCV 73.3, and platelets of 330.  Iron studies pending  Interval History:  Dominique FEBO36.o. female with medical history significant for iron deficiency anemia in setting of lupus who presents for a follow up visit. The patient's last visit was on 03/15/2021. In the interim since the last visit she underwent abdominal myomectomy on 05/03/2021 with Dr. BaElgie Congo On exam today [name] she tolerated the procedure well without needing a blood transfusion.  She reports that over the last month she became "really sick with my hormone levels increased".  She notes that she had issues with nausea and unfortunately developed the flu the next month.  The symptoms have subsequently resolved.  She reports that she has not had any extraneous bleeding in the interim since our last visit.  She reports that she had 1 normal menstrual cycle since the time of her surgery.  She reports that she is taking p.o. liquid iron as tolerating it quite well.  She was not able to tolerate regular p.o. iron therapy.  He reports he does have constipation on occasion.  She notes that her lupus is currently flaring and she is not established with a rheumatologist at this time.  She has requested to be established with Dr. ChVernelle Emerald She reports that she  developed a rash on her forearms as well as behind her ears bilaterally.  She is also having issues with her hair thinning.  She does that she is not having any joint pain though she does suffer from restless leg.  She does have fatigue on occasion.  She denies bleeding from any other sources such as dark stools, nosebleeds, gum bleeding.  She otherwise denies any fevers, chills, sweats, nausea, vomiting or diarrhea.  Full 10 point ROS is listed below.  MEDICAL HISTORY:  Past Medical History:  Diagnosis Date   Anemia    Complication of anesthesia    difficult intubation   Ectopic pregnancy    Glaucoma    follows up with opthalmologist once a year for pressure check   [place]    SURGICAL HISTORY: Past Surgical History:  Procedure Laterality Date   LAPAROSCOPY N/A 12/26/2012   Procedure: LAPAROSCOPY OPERATIVE REMOVAL OF ECTOPIC PREGNANCY;  Surgeon: TaGovernor SpeckingMD;  [facility];  Service: Gynecology;  Laterality: N/A;  Attempted   LAPAROTOMY N/A 12/26/2012   Procedure: LAPAROTOMY;  Surgeon: TaGovernor SpeckingMD;  [facility];  Service: Gynecology;  Laterality: N/A;   MYOMECTOMY N/A 11/10/2015   Procedure: MYOMECTOMY, Left Salpingectomy, Excision of Incisional Mass;  Surgeon: VaEldred MangesMD;  [facility];  Service: Gynecology;  Laterality: N/A;   MYOMECTOMY N/A 05/03/2021   Procedure: ABDOMINAL MYOMECTOMY;  Surgeon: RoEverett GraffMD;  [facility] Service: Gy[facility]cology;  Laterality: N/A;   OPERATIVE ULTRASOUND Right 12/14/2012  Procedure: OPERATIVE ULTRASOUND GUIDED INJECTION OF POTASSIUM CHLORIDE 2.5ML;  Surgeon: Governor Specking, MD;  Location: [facility];  Service: Gynecology;  Laterality: Right;   REFRACTIVE SURGERY     UNILATERAL SALPINGECTOMY Right 12/26/2012   Procedure: UNILATERAL SALPINGECTOMY;  Surgeon: Governor Specking, MD;  Location: [facility];  Service: Gynecology;  Laterality: Right;    SOCIAL HISTORY: Social History    Socioeconomic History   Marital status: Single    Spouse name: Not on file   Number of children: Not on file   Years of education: Not on file   Highest education level: Not on file  Occupational History   Not on file  Tobacco Use   Smoking status: Never   Smokeless tobacco: Never  Vaping Use   Vaping Use: Never used  Substance and Sexual Activity   Alcohol use: Yes    Comment: socially   Drug use: No   Sexual activity: Yes    Birth control/protection: None  Other Topics Concern   Not on file  Social History Narrative   Not on file   Social Determinants of Health   Financial Resource Strain: Not on file  Food Insecurity: Not on file  Transportation Needs: Not on file  Physical Activity: Not on file  Stress: Not on file  Social Connections: Not on file  Intimate Partner Violence: Not on file    FAMILY HISTORY: No family history on file.  ALLERGIES:  has No Known Allergies.  MEDICATIONS:  Current Outpatient Medications  Medication Sig Dispense Refill   diphenhydrAMINE (BENADRYL) 25 MG tablet Take 25 mg by mouth every 6 (six) hours as needed for allergies.     hydroxychloroquine (PLAQUENIL) 200 MG tablet Take 400 mg by mouth daily.     ibuprofen (ADVIL) 600 MG tablet Take 1 tablet (600 mg total) by mouth every 6 (six) hours as needed. 40 tablet 1   oxyCODONE-acetaminophen (PERCOCET) 5-325 MG tablet Take 1-2 tablets by mouth every 6 (six) hours as needed for severe pain or moderate pain. 30 tablet 0   No current facility-administered medications for this visit.    REVIEW OF SYSTEMS:   Constitutional: ( - ) fevers, ( - )  chills , ( - ) night sweats Eyes: ( - ) blurriness of vision, ( - ) double vision, ( - ) watery eyes Ears, nose, mouth, throat, and face: ( - ) mucositis, ( - ) sore throat Respiratory: ( - ) cough, ( - ) dyspnea, ( - ) wheezes Cardiovascular: ( - ) palpitation, ( - ) chest discomfort, ( - ) lower extremity swelling Gastrointestinal:  ( - )  nausea, ( - ) heartburn, ( - ) change in bowel habits Skin: ( - ) abnormal skin rashes Lymphatics: ( - ) new lymphadenopathy, ( - ) easy bruising Neurological: ( - ) numbness, ( - ) tingling, ( - ) new weaknesses Behavioral/Psych: ( - ) mood change, ( - ) new changes  All other systems were reviewed with the patient and are negative.  PHYSICAL EXAMINATION: ECOG PERFORMANCE STATUS: 1 - Symptomatic but completely ambulatory  Vitals:   09/29/21 1448  BP: (!) 148/102  Pulse: 84  Resp: 18  Temp: 99.7 F (37.6 C)  SpO2: 100%    Filed Weights   09/29/21 1448  Weight: 191 lb 6.4 oz (86.8 kg)    GENERAL: Well-appearing middle-aged African-American female, alert, no distress and comfortable SKIN: skin color, texture, turgor are normal, no rashes or significant lesions EYES: conjunctiva  are pink and non-injected, sclera clear LUNGS: clear to auscultation and percussion with normal breathing effort HEART: regular rate & rhythm and no murmurs and no lower extremity edema Musculoskeletal: no cyanosis of digits and no clubbing  PSYCH: alert & oriented x 3, fluent speech NEURO: no focal motor/sensory deficits  LABORATORY DATA:  I have reviewed the data as listed    Latest Ref Rng & Units 09/29/2021    2:31 PM 05/04/2021    4:15 AM 03/15/2021    9:51 AM  CBC  WBC 4.0 - 10.5 K/uL 3.5  14.1  5.1   Hemoglobin 12.0 - 15.0 g/dL 11.4  7.8  8.8   Hematocrit 36.0 - 46.0 % 35.0  25.3  29.8   Platelets 150 - 400 K/uL 330  401  432        Latest Ref Rng & Units 05/04/2021    4:15 AM 04/29/2021   11:22 AM 03/15/2021    9:51 AM  CMP  Glucose 70 - 99 mg/dL 139  104  95   BUN 6 - 20 mg/dL 11  11  22    Creatinine 0.44 - 1.00 mg/dL 0.78  0.73  0.76   Sodium 135 - 145 mmol/L 135  139  138   Potassium 3.5 - 5.1 mmol/L 4.2  4.2  3.7   Chloride 98 - 111 mmol/L 104  106  109   CO2 22 - 32 mmol/L 24  26  24    Calcium 8.9 - 10.3 mg/dL 8.2  8.6  8.6   Total Protein 6.5 - 8.1 g/dL   7.1   Total Bilirubin  0.3 - 1.2 mg/dL   0.3   Alkaline Phos 38 - 126 U/L   52   AST 15 - 41 U/L   15   ALT 0 - 44 U/L   13     RADIOGRAPHIC STUDIES: I have personally reviewed the radiological images as listed and agreed with the findings in the report. No results found.  ASSESSMENT & PLAN Trinika A Nishikawa 36 y.o. female with medical history significant for iron deficiency anemia in setting of lupus/heavy GYN bleeding who presents for a follow up visit.   After review of the labs, review of the records, and discussion with the patient the patients findings are most consistent with multifactorial iron deficiency anemia.  This is likely secondary to the patient's heavy menstrual cycles and history of lupus.  We proceeded with IV iron sucrose 239m x 5 doses with a moderate response in the Hgb.  Iron labs from today are still pending.  In addition to her iron deficiency it is likely that her hemoglobin is suppressed by her chronic inflammatory condition.  The patient voiced understanding of this plan moving forward.  The patient notes that she is out of sick leave from work and is afraid that if she tries to take an additional 5 days off for continued IV iron sucrose that she may lose her job.  She is requesting fewer sessions so that she may keep her employment.  Additionally 1 large dose would be preferred in the setting of her active bleeding.   #Iron Deficiency Anemia in Setting of Lupus -- Findings are consistent with iron deficiency anemia due to multifactorial causes including inflammatory condition of lupus and heavy menstrual cycles. --received 5 doss of 2039mIV iron sucrose from 7/11-09/28/2020 with modest bump in Hgb to 9.4.  --Hgb 11.4. Iron panel pending. Patient energy levels are improved overall.  -- If  more IV iron is required will plan to proceed with Monoferric 1000 mg IV once in order to bolster her iron levels.  This is preferred for the patient as she is no longer able to take any more time off work  and is afraid that if she has to do 5 sessions that she may lose her job. --RTC in 6 months or sooner if Hgb levels drop or more IV iron is required   #Lupus -- Patient does not currently have a rheumatologist and is requesting referral to Dr. Vernelle Emerald --Referral placed today.  No orders of the defined types were placed in this encounter.  All questions were answered. The patient knows to call the clinic with any problems, questions or concerns.  A total of more than 30 minutes were spent on this encounter with face-to-face time and non-face-to-face time, including preparing to see the patient, ordering tests and/or medications, counseling the patient and coordination of care as outlined above.   Ledell Peoples, MD Department of Hematology/Oncology [place] at [facility] Phone: [phone] Pager: [phone] Email: [email or web address]  09/29/2021 3:31 PM

## 2021-09-30 LAB — FERRITIN: Ferritin: 7 ng/mL — ABNORMAL LOW (ref 11–307)

## 2021-10-22 ENCOUNTER — Other Ambulatory Visit: Payer: BC Managed Care – PPO

## 2021-10-22 ENCOUNTER — Ambulatory Visit: Payer: BC Managed Care – PPO | Admitting: Hematology and Oncology

## 2021-11-03 DIAGNOSIS — N939 Abnormal uterine and vaginal bleeding, unspecified: Secondary | ICD-10-CM | POA: Diagnosis not present

## 2021-11-15 NOTE — Progress Notes (Unsigned)
Office Visit Note  Patient: Dominique Jones             Date of Birth: [birth year]           MRN: [id number]             PCP: Patient, No Pcp Per Referring: Orson Slick, MD Visit Date: 11/16/2021 Occupation: '@GUAROCC'$ @  Subjective:  No chief complaint on file.   History of Present Illness: Dominique Jones is a 36 y.o. female here for evaluation and treatment of lupus. She has a history of iron deficiency anemia associated with lupus and with heavy uterine bleeding. She had myomectomy March this year and multiple rounds with IV iron replacement. Previous symptoms apparently including skin rashes and lymphadenopathy.***   Activities of Daily Living:  Patient reports morning stiffness for *** {minute/hour:19697}.   Patient {ACTIONS;DENIES/REPORTS:21021675::"Denies"} nocturnal pain.  Difficulty dressing/grooming: {ACTIONS;DENIES/REPORTS:21021675::"Denies"} Difficulty climbing stairs: {ACTIONS;DENIES/REPORTS:21021675::"Denies"} Difficulty getting out of chair: {ACTIONS;DENIES/REPORTS:21021675::"Denies"} Difficulty using hands for taps, buttons, cutlery, and/or writing: {ACTIONS;DENIES/REPORTS:21021675::"Denies"}  No Rheumatology ROS completed.   PMFS History:  Patient Active Problem List   Diagnosis Date Noted   Excessive and frequent menstruation 05/03/2021   Status post myomectomy 05/03/2021   Iron deficiency anemia due to chronic blood loss 08/24/2020   Hydrosalpinx 11/10/2015   Fibroids 12/30/2012   Anemia 12/30/2012   Laceration of palate 12/30/2012    Past Medical History:  Diagnosis Date   Anemia    Complication of anesthesia    difficult intubation   Ectopic pregnancy    Glaucoma    follows up with opthalmologist once a year for pressure check   Lupus ([place])     No family history on file. Past Surgical History:  Procedure Laterality Date   LAPAROSCOPY N/A 12/26/2012   Procedure: LAPAROSCOPY OPERATIVE REMOVAL OF ECTOPIC PREGNANCY;  Surgeon: Governor Specking,  MD;  Location: [facility];  Service: Gynecology;  Laterality: N/A;  Attempted   LAPAROTOMY N/A 12/26/2012   Procedure: LAPAROTOMY;  Surgeon: Governor Specking, MD;  Location: [facility];  Service: Gynecology;  Laterality: N/A;   MYOMECTOMY N/A 11/10/2015   Procedure: MYOMECTOMY, Left Salpingectomy, Excision of Incisional Mass;  Surgeon: Eldred Manges, MD;  Location: [facility];  Service: Gynecology;  Laterality: N/A;   MYOMECTOMY N/A 05/03/2021   Procedure: ABDOMINAL MYOMECTOMY;  Surgeon: Everett Graff, MD;  Location: [facility];  Service: Gynecology;  Laterality: N/A;   OPERATIVE ULTRASOUND Right 12/14/2012   Procedure: OPERATIVE ULTRASOUND GUIDED INJECTION OF POTASSIUM CHLORIDE 2.5ML;  Surgeon: Governor Specking, MD;  Location: [facility];  Service: Gynecology;  Laterality: Right;   REFRACTIVE SURGERY     UNILATERAL SALPINGECTOMY Right 12/26/2012   Procedure: UNILATERAL SALPINGECTOMY;  Surgeon: Governor Specking, MD;  Location: [facility];  Service: Gynecology;  Laterality: Right;   Social History   Social History Narrative   Not on file    There is no immunization history on file for this patient.   Objective: Vital Signs: There were no vitals taken for this visit.   Physical Exam   Musculoskeletal Exam: ***  CDAI Exam: CDAI Score: -- Patient Global: --; Provider Global: -- Swollen: --; Tender: -- Joint Exam 11/16/2021   No joint exam has been documented for this visit   There is currently no information documented on the homunculus. Go to the Rheumatology activity and complete the homunculus joint exam.  Investigation: No additional findings.  Imaging: No results found.  Recent Labs: Lab Results  Component Value Date  WBC 3.5 (L) 09/29/2021   HGB 11.4 (L) 09/29/2021   PLT 330 09/29/2021   NA 139 09/29/2021   K 3.9 09/29/2021   CL 108 09/29/2021   CO2 24 09/29/2021   GLUCOSE 108 (H) 09/29/2021   BUN 13 09/29/2021   CREATININE 0.78 09/29/2021   BILITOT 0.3  09/29/2021   ALKPHOS 69 09/29/2021   AST 17 09/29/2021   ALT 15 09/29/2021   PROT 7.7 09/29/2021   ALBUMIN 4.1 09/29/2021   CALCIUM 8.7 (L) 09/29/2021   GFRAA >60 11/02/2015    Speciality Comments: No specialty comments available.  Procedures:  No procedures performed Allergies: Patient has no known allergies.   Assessment / Plan:     Visit Diagnoses: No diagnosis found.  Orders: No orders of the defined types were placed in this encounter.  No orders of the defined types were placed in this encounter.   Face-to-face time spent with patient was *** minutes. Greater than 50% of time was spent in counseling and coordination of care.  Follow-Up Instructions: No follow-ups on file.   Collier Salina, MD  Note - This record has been created using [employer].  Chart creation errors have been sought, but may not always  have been located. Such creation errors do not reflect on  the standard of medical care.

## 2021-11-16 ENCOUNTER — Ambulatory Visit: Payer: BC Managed Care – PPO | Attending: Internal Medicine | Admitting: Internal Medicine

## 2021-11-16 ENCOUNTER — Encounter: Payer: Self-pay | Admitting: Internal Medicine

## 2021-11-16 VITALS — BP 151/107 | HR 85 | Resp 14 | Ht 67.0 in | Wt 190.4 lb

## 2021-11-16 DIAGNOSIS — M79605 Pain in left leg: Secondary | ICD-10-CM | POA: Diagnosis not present

## 2021-11-16 DIAGNOSIS — M79604 Pain in right leg: Secondary | ICD-10-CM | POA: Diagnosis not present

## 2021-11-16 DIAGNOSIS — E559 Vitamin D deficiency, unspecified: Secondary | ICD-10-CM

## 2021-11-16 DIAGNOSIS — M3219 Other organ or system involvement in systemic lupus erythematosus: Secondary | ICD-10-CM | POA: Diagnosis not present

## 2021-11-16 MED ORDER — TRIAMCINOLONE ACETONIDE 0.5 % EX OINT: 1.0000 | TOPICAL_OINTMENT | Freq: Two times a day (BID) | CUTANEOUS | 1 refills | Status: DC | PRN

## 2021-11-16 MED ORDER — HYDROXYCHLOROQUINE SULFATE 200 MG PO TABS: 200.0000 mg | ORAL_TABLET | Freq: Every day | ORAL | 2 refills | Status: DC

## 2021-11-16 MED ORDER — GABAPENTIN 300 MG PO CAPS: 300.0000 mg | ORAL_CAPSULE | Freq: Every day | ORAL | 2 refills | Status: DC

## 2021-11-17 LAB — C3 AND C4
C3 Complement: 153 mg/dL (ref 83–193)
C4 Complement: 36 mg/dL (ref 15–57)

## 2021-11-17 LAB — PROTEIN / CREATININE RATIO, URINE
Creatinine, Urine: 141 mg/dL (ref 20–275)
Protein/Creat Ratio: 64 mg/g creat (ref 24–184)
Protein/Creatinine Ratio: 0.064 mg/mg creat (ref 0.024–0.184)
Total Protein, Urine: 9 mg/dL (ref 5–24)

## 2021-11-17 LAB — SEDIMENTATION RATE: Sed Rate: 11 mm/h (ref 0–20)

## 2021-11-17 LAB — ANTI-DNA ANTIBODY, DOUBLE-STRANDED: ds DNA Ab: <1 IU/mL

## 2021-11-17 LAB — VITAMIN D 25 HYDROXY (VIT D DEFICIENCY, FRACTURES): Vit D, 25-Hydroxy: 19 ng/mL — ABNORMAL LOW (ref 30–100)

## 2021-11-18 NOTE — Progress Notes (Signed)
Lab results look good with no evidence of the systemic lupus inflammation or any kidney involvement.  I recommend she continue the medications as planned in our clinic visit.  Vitamin D level is 19 this is deficient and ideally should be greater than 30.  I recommend she start taking daily supplementation with 4000 units.  Over-the-counter supplements are fine, just needs to check the total amount adds up.  We can recheck in a few months to see if this brings her level up to normal.

## 2022-01-03 ENCOUNTER — Other Ambulatory Visit: Payer: Self-pay | Admitting: Physician Assistant

## 2022-01-03 DIAGNOSIS — D500 Iron deficiency anemia secondary to blood loss (chronic): Secondary | ICD-10-CM

## 2022-01-04 ENCOUNTER — Ambulatory Visit: Payer: BC Managed Care – PPO | Admitting: Physician Assistant

## 2022-01-04 ENCOUNTER — Other Ambulatory Visit: Payer: BC Managed Care – PPO

## 2022-01-17 ENCOUNTER — Ambulatory Visit: Payer: BC Managed Care – PPO | Admitting: Internal Medicine

## 2022-01-17 NOTE — Progress Notes (Deleted)
Office Visit Note  Patient: Dominique Jones             Date of Birth: 22-Oct-1985           MRN: [id number]             PCP: Patient, No Pcp Per Referring: No ref. provider found Visit Date: 01/17/2022   Subjective:  No chief complaint on file.   History of Present Illness: Dominique Jones is a 36 y.o. 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 Also restless leg syndrome with gabapentin 300 mg at night and ongoing iron replacement infusions with hematology.***   Previous HPI 11/16/21 [name] is a 36 y.o. female here for evaluation and treatment of lupus.  She was previously a patient at [facility] rheumatology with Dr. Amil Amen last seen summer of last year.  Originally diagnosed with lupus for evaluation symptoms starting with facial rashes in December 2020.  Other associated symptoms including lymphadenopathy, myalgias, GI upset, unintentional weight loss, and fatigue.  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  Laboratory testing previously showed positive ANA and SSA antibodies otherwise negative lupus serology.  Symptoms responded to hydroxychloroquine 200 mg daily.  However she felt rheumatology plan was not ideal as she had multiple unresolved symptoms especially with continued muscle pains and fatigue despite the medication. Her worst area is in the bilateral legs.  She has significant restless leg type of symptoms sometimes with aches and sometimes with burning type of pain throughout her legs.  This is not associated with any visible changes or swelling and usually better during the daytime.  She does not have significant peripheral edema.  Does also have diffuse body pains pretty frequently. Skin rashes started on the face but has subsequently developed involvement throughout her upper extremities and on  the neck.  She has minimal problems on the trunk and no rashes on her legs.  These are mostly hyperpigmented she has seen hair loss at affected sites but without any permanent scalp patches affected. She has a history of iron deficiency anemia associated with lupus and with heavy uterine bleeding. She had myomectomy March this year and multiple rounds with IV iron replacement. Cervical lymph node swelling comes and goes on both sides of the neck she has tenderness occasionally but more often just bulky or palpable nodules without associated symptoms.  She had at least 1 episode of lymph node swelling in the preauricular area. Does report some dry eyes and mouth sensations does not require specific medications for management.  She is not seeing mouth or nasal ulcers.   No Rheumatology ROS completed.   PMFS History:  Patient Active Problem List   Diagnosis Date Noted   Other organ or system involvement in systemic lupus erythematosus ([place]) 11/16/2021   Bilateral leg pain 11/16/2021   Vitamin D deficiency 11/16/2021   Excessive and frequent menstruation 05/03/2021   Status post myomectomy 05/03/2021   Iron deficiency anemia due to chronic blood loss 08/24/2020   Hydrosalpinx 11/10/2015   Fibroids 12/30/2012   Anemia 12/30/2012   Laceration of palate 12/30/2012    Past Medical History:  Diagnosis Date   Anemia    Complication of anesthesia    difficult intubation   Ectopic pregnancy    Glaucoma    follows up with opthalmologist once a year for pressure check  Lupus ([place])     Family History  Problem Relation Age of Onset   Diabetes Mother    Glaucoma Father    Past Surgical History:  Procedure Laterality Date   LAPAROSCOPY N/A 12/26/2012   Procedure: LAPAROSCOPY OPERATIVE REMOVAL OF ECTOPIC PREGNANCY;  Surgeon: Governor Specking, MD;  Location: [facility];  Service: Gynecology;  Laterality: N/A;  Attempted   LAPAROTOMY N/A 12/26/2012   Procedure: LAPAROTOMY;  Surgeon: Governor Specking,  MD;  Location: [facility];  Service: Gynecology;  Laterality: N/A;   MYOMECTOMY N/A 11/10/2015   Procedure: MYOMECTOMY, Left Salpingectomy, Excision of Incisional Mass;  Surgeon: Eldred Manges, MD;  Location: [facility];  Service: Gynecology;  Laterality: N/A;   MYOMECTOMY N/A 05/03/2021   Procedure: ABDOMINAL MYOMECTOMY;  Surgeon: Everett Graff, MD;  Location: [facility];  Service: Gynecology;  Laterality: N/A;   OPERATIVE ULTRASOUND Right 12/14/2012   Procedure: OPERATIVE ULTRASOUND GUIDED INJECTION OF POTASSIUM CHLORIDE 2.5ML;  Surgeon: Governor Specking, MD;  Location: [facility];  Service: Gynecology;  Laterality: Right;   REFRACTIVE SURGERY     UNILATERAL SALPINGECTOMY Right 12/26/2012   Procedure: UNILATERAL SALPINGECTOMY;  Surgeon: Governor Specking, MD;  Location: [facility];  Service: Gynecology;  Laterality: Right;   Social History   Social History Narrative   Not on file    There is no immunization history on file for this patient.   Objective: Vital Signs: There were no vitals taken for this visit.   Physical Exam   Musculoskeletal Exam: ***  CDAI Exam: CDAI Score: -- Patient Global: --; Provider Global: -- Swollen: --; Tender: -- Joint Exam 01/17/2022   No joint exam has been documented for this visit   There is currently no information documented on the homunculus. Go to the Rheumatology activity and complete the homunculus joint exam.  Investigation: No additional findings.  Imaging: No results found.  Recent Labs: Lab Results  Component Value Date   WBC 3.5 (L) 09/29/2021   HGB 11.4 (L) 09/29/2021   PLT 330 09/29/2021   NA 139 09/29/2021   K 3.9 09/29/2021   CL 108 09/29/2021   CO2 24 09/29/2021   GLUCOSE 108 (H) 09/29/2021   BUN 13 09/29/2021   CREATININE 0.78 09/29/2021   BILITOT 0.3 09/29/2021   ALKPHOS 69 09/29/2021   AST 17 09/29/2021   ALT 15 09/29/2021   PROT 7.7 09/29/2021   ALBUMIN 4.1 09/29/2021   CALCIUM 8.7 (L) 09/29/2021    GFRAA >60 11/02/2015    Speciality Comments: No specialty comments available.  Procedures:  No procedures performed Allergies: Patient has no known allergies.   Assessment / Plan:     Visit Diagnoses: No diagnosis found.  ***  Orders: No orders of the defined types were placed in this encounter.  No orders of the defined types were placed in this encounter.    Follow-Up Instructions: No follow-ups on file.   Collier Salina, MD  Note - This record has been created using [employer].  Chart creation errors have been sought, but may not always  have been located. Such creation errors do not reflect on  the standard of medical care.

## 2022-02-08 NOTE — Progress Notes (Unsigned)
Office Visit Note  Patient: Dominique Jones             Date of Birth: [birth year]           MRN: [id number]             PCP: Patient, No Pcp Per Referring: No ref. provider found Visit Date: 02/09/2022   Subjective:  No chief complaint on file.   History of Present Illness: Dominique Jones is a 36 y.o. 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 female here for evaluation and treatment of lupus.  She was previously a patient at [facility] rheumatology with Dr. Amil Amen last seen summer of last year.  Originally diagnosed with lupus for evaluation symptoms starting with facial rashes in December 2020.  Other associated symptoms including lymphadenopathy, myalgias, GI upset, unintentional weight loss, and fatigue.  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  Laboratory testing previously showed positive ANA and SSA antibodies otherwise negative lupus serology.  Symptoms responded to hydroxychloroquine 200 mg daily.  However she felt rheumatology plan was not ideal as she had multiple unresolved symptoms especially with continued muscle pains and fatigue despite the medication. Her worst area is in the bilateral legs.  She has significant restless leg type of symptoms sometimes with aches and sometimes with burning type of pain throughout her legs.  This is not associated with any visible changes or swelling and usually better during the daytime.  She does not have significant peripheral edema.  Does also have diffuse body pains pretty frequently. Skin rashes started on the face but has subsequently developed involvement throughout her upper extremities and on the neck.  She has minimal problems on the trunk and no rashes on her legs.  These are mostly hyperpigmented she has seen hair loss at affected sites but without any  permanent scalp patches affected. She has a history of iron deficiency anemia associated with lupus and with heavy uterine bleeding. She had myomectomy March this year and multiple rounds with IV iron replacement. Cervical lymph node swelling comes and goes on both sides of the neck she has tenderness occasionally but more often just bulky or palpable nodules without associated symptoms.  She had at least 1 episode of lymph node swelling in the preauricular area. Does report some dry eyes and mouth sensations does not require specific medications for management.  She is not seeing mouth or nasal ulcers.    No Rheumatology ROS completed.   PMFS History:  Patient Active Problem List   Diagnosis Date Noted   Other organ or system involvement in systemic lupus erythematosus ([place]) 11/16/2021   Bilateral leg pain 11/16/2021   Vitamin D deficiency 11/16/2021   Excessive and frequent menstruation 05/03/2021   Status post myomectomy 05/03/2021   Iron deficiency anemia due to chronic blood loss 08/24/2020   Hydrosalpinx 11/10/2015   Fibroids 12/30/2012   Anemia 12/30/2012   Laceration of palate 12/30/2012    Past Medical History:  Diagnosis Date   Anemia    Complication of anesthesia    difficult intubation   Ectopic pregnancy    Glaucoma    follows up with opthalmologist once a year for pressure check   Lupus ([place])     Family History  Problem Relation Age of Onset  Diabetes Mother    Glaucoma Father    Past Surgical History:  Procedure Laterality Date   LAPAROSCOPY N/A 12/26/2012   Procedure: LAPAROSCOPY OPERATIVE REMOVAL OF ECTOPIC PREGNANCY;  Surgeon: Governor Specking, MD;  Location: [facility];  Service: Gynecology;  Laterality: N/A;  Attempted   LAPAROTOMY N/A 12/26/2012   Procedure: LAPAROTOMY;  Surgeon: Governor Specking, MD;  Location: [facility];  Service: Gynecology;  Laterality: N/A;   MYOMECTOMY N/A 11/10/2015   Procedure: MYOMECTOMY, Left Salpingectomy, Excision of Incisional  Mass;  Surgeon: Eldred Manges, MD;  Location: [facility];  Service: Gynecology;  Laterality: N/A;   MYOMECTOMY N/A 05/03/2021   Procedure: ABDOMINAL MYOMECTOMY;  Surgeon: Everett Graff, MD;  Location: [facility];  Service: Gynecology;  Laterality: N/A;   OPERATIVE ULTRASOUND Right 12/14/2012   Procedure: OPERATIVE ULTRASOUND GUIDED INJECTION OF POTASSIUM CHLORIDE 2.5ML;  Surgeon: Governor Specking, MD;  Location: [facility];  Service: Gynecology;  Laterality: Right;   REFRACTIVE SURGERY     UNILATERAL SALPINGECTOMY Right 12/26/2012   Procedure: UNILATERAL SALPINGECTOMY;  Surgeon: Governor Specking, MD;  Location: [facility];  Service: Gynecology;  Laterality: Right;   Social History   Social History Narrative   Not on file    There is no immunization history on file for this patient.   Objective: Vital Signs: There were no vitals taken for this visit.   Physical Exam   Musculoskeletal Exam: ***  CDAI Exam: CDAI Score: -- Patient Global: --; Provider Global: -- Swollen: --; Tender: -- Joint Exam 02/09/2022   No joint exam has been documented for this visit   There is currently no information documented on the homunculus. Go to the Rheumatology activity and complete the homunculus joint exam.  Investigation: No additional findings.  Imaging: No results found.  Recent Labs: Lab Results  Component Value Date   WBC 3.5 (L) 09/29/2021   HGB 11.4 (L) 09/29/2021   PLT 330 09/29/2021   NA 139 09/29/2021   K 3.9 09/29/2021   CL 108 09/29/2021   CO2 24 09/29/2021   GLUCOSE 108 (H) 09/29/2021   BUN 13 09/29/2021   CREATININE 0.78 09/29/2021   BILITOT 0.3 09/29/2021   ALKPHOS 69 09/29/2021   AST 17 09/29/2021   ALT 15 09/29/2021   PROT 7.7 09/29/2021   ALBUMIN 4.1 09/29/2021   CALCIUM 8.7 (L) 09/29/2021   GFRAA >60 11/02/2015    Speciality Comments: No specialty comments available.  Procedures:  No procedures performed Allergies: Patient has no known  allergies.   Assessment / Plan:     Visit Diagnoses: No diagnosis found.  ***  Orders: No orders of the defined types were placed in this encounter.  No orders of the defined types were placed in this encounter.    Follow-Up Instructions: No follow-ups on file.   Collier Salina, MD  Note - This record has been created using [employer].  Chart creation errors have been sought, but may not always  have been located. Such creation errors do not reflect on  the standard of medical care.

## 2022-02-09 ENCOUNTER — Ambulatory Visit: Payer: BC Managed Care – PPO | Attending: Internal Medicine | Admitting: Internal Medicine

## 2022-02-09 ENCOUNTER — Encounter: Payer: Self-pay | Admitting: Internal Medicine

## 2022-02-09 VITALS — BP 168/122 | HR 76 | Resp 15 | Ht 66.0 in | Wt 193.0 lb

## 2022-02-09 DIAGNOSIS — B009 Herpesviral infection, unspecified: Secondary | ICD-10-CM | POA: Insufficient documentation

## 2022-02-09 DIAGNOSIS — L932 Other local lupus erythematosus: Secondary | ICD-10-CM | POA: Diagnosis not present

## 2022-02-09 DIAGNOSIS — L931 Subacute cutaneous lupus erythematosus: Secondary | ICD-10-CM

## 2022-02-09 DIAGNOSIS — E559 Vitamin D deficiency, unspecified: Secondary | ICD-10-CM

## 2022-02-09 DIAGNOSIS — M79604 Pain in right leg: Secondary | ICD-10-CM | POA: Diagnosis not present

## 2022-02-09 DIAGNOSIS — M79605 Pain in left leg: Secondary | ICD-10-CM | POA: Diagnosis not present

## 2022-02-09 DIAGNOSIS — M3219 Other organ or system involvement in systemic lupus erythematosus: Secondary | ICD-10-CM | POA: Diagnosis not present

## 2022-02-09 MED ORDER — HYDROXYCHLOROQUINE SULFATE 200 MG PO TABS: 200.0000 mg | ORAL_TABLET | Freq: Two times a day (BID) | ORAL | 2 refills | Status: DC

## 2022-02-09 MED ORDER — GABAPENTIN 600 MG PO TABS: 600.0000 mg | ORAL_TABLET | Freq: Every day | ORAL | 2 refills | Status: DC

## 2022-02-10 ENCOUNTER — Other Ambulatory Visit: Payer: Self-pay | Admitting: Internal Medicine

## 2022-02-10 DIAGNOSIS — M79604 Pain in right leg: Secondary | ICD-10-CM

## 2022-02-10 LAB — CBC WITH DIFFERENTIAL/PLATELET
Absolute Monocytes: 660 cells/uL (ref 200–950)
Basophils Absolute: 78 cells/uL (ref 0–200)
Basophils Relative: 1.3 %
Eosinophils Absolute: 750 cells/uL — ABNORMAL HIGH (ref 15–500)
Eosinophils Relative: 12.5 %
HCT: 37.3 % (ref 35.0–45.0)
Hemoglobin: 12 g/dL (ref 11.7–15.5)
Lymphs Abs: 2214 cells/uL (ref 850–3900)
MCH: 25.8 pg — ABNORMAL LOW (ref 27.0–33.0)
MCHC: 32.2 g/dL (ref 32.0–36.0)
MCV: 80.2 fL (ref 80.0–100.0)
MPV: 11.5 fL (ref 7.5–12.5)
Monocytes Relative: 11 %
Neutro Abs: 2298 cells/uL (ref 1500–7800)
Neutrophils Relative %: 38.3 %
Platelets: 383 10*3/uL (ref 140–400)
RBC: 4.65 10*6/uL (ref 3.80–5.10)
RDW: 14.9 % (ref 11.0–15.0)
Total Lymphocyte: 36.9 %
WBC: 6 10*3/uL (ref 3.8–10.8)

## 2022-02-10 LAB — VITAMIN D 25 HYDROXY (VIT D DEFICIENCY, FRACTURES): Vit D, 25-Hydroxy: 25 ng/mL — ABNORMAL LOW (ref 30–100)

## 2022-07-07 ENCOUNTER — Telehealth: Payer: Self-pay | Admitting: Internal Medicine

## 2022-07-07 ENCOUNTER — Other Ambulatory Visit: Payer: Self-pay | Admitting: Internal Medicine

## 2022-07-07 DIAGNOSIS — M79605 Pain in left leg: Secondary | ICD-10-CM

## 2022-07-07 NOTE — Telephone Encounter (Signed)
LMOM for patient to call and schedule follow-up appointment.   °

## 2022-07-07 NOTE — Telephone Encounter (Signed)
Last Fill: 02/09/2022  Next Visit: was due in March 2024, message sent to the front desk to schedule.   Last Visit: 02/09/2022  DX: Bilateral leg pain   Current Dose per office note on 02/09/2022: Partial benefit with the low-dose gabapentin we will try to increase to 600 mg dose.   Okay to refill 30 day supply of gabapentin?

## 2022-07-07 NOTE — Telephone Encounter (Signed)
-----   Message from Ellen Henri, CMA sent at 07/07/2022 10:50 AM EDT ----- Patient was due for a follow up with Dr. Dimple Casey in March. Can you please call to schedule? Thanks!

## 2022-07-20 ENCOUNTER — Encounter: Payer: Self-pay | Admitting: Internal Medicine

## 2022-07-20 ENCOUNTER — Ambulatory Visit: Payer: BC Managed Care – PPO | Attending: Internal Medicine | Admitting: Internal Medicine

## 2022-07-20 VITALS — BP 140/94 | HR 98 | Resp 14 | Ht 66.0 in | Wt 181.0 lb

## 2022-07-20 DIAGNOSIS — Z79899 Other long term (current) drug therapy: Secondary | ICD-10-CM

## 2022-07-20 DIAGNOSIS — E559 Vitamin D deficiency, unspecified: Secondary | ICD-10-CM

## 2022-07-20 DIAGNOSIS — L932 Other local lupus erythematosus: Secondary | ICD-10-CM

## 2022-07-20 DIAGNOSIS — R591 Generalized enlarged lymph nodes: Secondary | ICD-10-CM

## 2022-07-20 DIAGNOSIS — M79604 Pain in right leg: Secondary | ICD-10-CM

## 2022-07-20 DIAGNOSIS — M3219 Other organ or system involvement in systemic lupus erythematosus: Secondary | ICD-10-CM

## 2022-07-20 DIAGNOSIS — M79605 Pain in left leg: Secondary | ICD-10-CM

## 2022-07-20 MED ORDER — HYDROXYCHLOROQUINE SULFATE 200 MG PO TABS: 200.0000 mg | ORAL_TABLET | Freq: Two times a day (BID) | ORAL | 1 refills | Status: DC

## 2022-07-20 MED ORDER — GABAPENTIN 600 MG PO TABS: 600.0000 mg | ORAL_TABLET | Freq: Every day | ORAL | 1 refills | Status: DC

## 2022-07-20 MED ORDER — TRIAMCINOLONE ACETONIDE 0.5 % EX OINT: 1.0000 | TOPICAL_OINTMENT | Freq: Two times a day (BID) | CUTANEOUS | 1 refills | Status: DC | PRN

## 2022-07-20 NOTE — Progress Notes (Signed)
Office Visit Note  Patient: Dominique Jones             Date of Birth: 14-Dec-1985           MRN: [id number]             PCP: Patient, No Pcp Per Referring: No ref. provider found Visit Date: 07/20/2022   Subjective:  Follow-up (Patient states she does have a slight headache today.)   History of Present Illness: Dominique Jones is a 37 y.o. female here for follow up for chronic cutaneous lupus on hydroxychloroquine 400 mg daily and on gabapentin 600 mg for leg pain at night.  She is additional improvement with the increase gabapentin dose and feels it is working pretty well.  Does not notice any drowsiness lightheadedness or other side effects.  She has had a few areas of skin irritation some spots around the ear for which she is topical triamcinolone with improvement.  Some of the red irritated spots on her arm resolved into persistent hyperpigmentation after several months but does not have any new lesions.  Not experiencing any new joint pain or swelling.  She has not seen ophthalmology for monitoring on hydroxychloroquine.  She does already have an eye doctor who she sees for glaucoma.   Previous HPI 02/09/22 Dominique Jones is a 38 y.o. 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 She has noticed some improvement in skin rash especially no more hair falling out or visibly thinning. She finds the gabapentin partially helpful but sometimes does not notice a benefit.  Her appetite is decreased since our last visit.   Previous HPI 11/16/21 Dominique Jones is a 37 y.o. female here for evaluation and treatment of lupus.  She was previously a patient at [facility]ology with Dr. Dierdre Forth last seen summer of last year.  Originally diagnosed with lupus for evaluation symptoms starting with facial rashes in December 2020.  Other associated symptoms including lymphadenopathy, myalgias, GI upset,  unintentional weight loss, and fatigue.  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  Laboratory testing previously showed positive ANA and SSA antibodies otherwise negative lupus serology.  Symptoms responded to hydroxychloroquine 200 mg daily.  However she felt rheumatology plan was not ideal as she had multiple unresolved symptoms especially with continued muscle pains and fatigue despite the medication. Her worst area is in the bilateral legs.  She has significant restless leg type of symptoms sometimes with aches and sometimes with burning type of pain throughout her legs.  This is not associated with any visible changes or swelling and usually better during the daytime.  She does not have significant peripheral edema.  Does also have diffuse body pains pretty frequently. Skin rashes started on the face but has subsequently developed involvement throughout her upper extremities and on the neck.  She has minimal problems on the trunk and no rashes on her legs.  These are mostly hyperpigmented she has seen hair loss at affected sites but without any permanent scalp patches affected. She has a history of iron deficiency anemia associated with lupus and with heavy uterine bleeding. She had myomectomy March this year and multiple rounds with IV iron replacement. Cervical lymph node swelling comes and goes on both sides of the neck she has tenderness occasionally but more often just bulky or palpable nodules without associated symptoms.  She  had at least 1 episode of lymph node swelling in the preauricular area. Does report some dry eyes and mouth sensations does not require specific medications for management.  She is not seeing mouth or nasal ulcers.   Review of Systems  Constitutional:  Positive for fatigue.  HENT:  Negative for mouth sores and mouth dryness.   Eyes:  Positive for dryness.  Respiratory:  Negative for shortness of breath.   Cardiovascular:  Negative for chest  pain and palpitations.  Gastrointestinal:  Negative for blood in stool, constipation and diarrhea.  Endocrine: Negative for increased urination.  Genitourinary:  Negative for involuntary urination.  Musculoskeletal:  Positive for myalgias and myalgias. Negative for joint pain, gait problem, joint pain, joint swelling, muscle weakness, morning stiffness and muscle tenderness.  Skin:  Negative for color change, rash, hair loss and sensitivity to sunlight.  Allergic/Immunologic: Negative for susceptible to infections.  Neurological:  Positive for headaches. Negative for dizziness.  Hematological:  Negative for swollen glands.  Psychiatric/Behavioral:  Positive for sleep disturbance. Negative for depressed mood. The patient is not nervous/anxious.     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   Lymphadenopathy 02/21/2019   Sebaceous cyst 03/22/2016   Hydrosalpinx 11/10/2015   Fibroids 12/30/2012   Anemia 12/30/2012   Laceration of palate 12/30/2012    Past Medical History:  Diagnosis Date   Anemia    Complication of anesthesia    difficult intubation   Ectopic pregnancy    Glaucoma    follows up with opthalmologist once a year for pressure check   Lupus ([facility])     Family History  Problem Relation Age of Onset   Diabetes Mother    Glaucoma Father    Past Surgical History:  Procedure Laterality Date   LAPAROSCOPY N/A 12/26/2012   Procedure: LAPAROSCOPY OPERATIVE REMOVAL OF ECTOPIC PREGNANCY;   Surgeon: Fermin Schwab, MD;  Location: [facility];  Service: Gynecology;  Laterality: N/A;  Attempted   LAPAROTOMY N/A 12/26/2012   Procedure: LAPAROTOMY;  Surgeon: Fermin Schwab, MD;  Location: [facility];  Service: Gynecology;  Laterality: N/A;   MYOMECTOMY N/A 11/10/2015   Procedure: MYOMECTOMY, Left Salpingectomy, Excision of Incisional Mass;  Surgeon: Hal Morales, MD;  Location: [facility];  Service: Gynecology;  Laterality: N/A;   MYOMECTOMY N/A 05/03/2021   Procedure: ABDOMINAL MYOMECTOMY;  Surgeon: Osborn Coho, MD;  Location: [facility];  Service: Gynecology;  Laterality: N/A;   OPERATIVE ULTRASOUND Right 12/14/2012   Procedure: OPERATIVE ULTRASOUND GUIDED INJECTION OF POTASSIUM CHLORIDE 2.5ML;  Surgeon: Fermin Schwab, MD;  Location: [facility];  Service: Gynecology;  Laterality: Right;   REFRACTIVE SURGERY     UNILATERAL SALPINGECTOMY Right 12/26/2012   Procedure: UNILATERAL SALPINGECTOMY;  Surgeon: Fermin Schwab, MD;  Location: [facility];  Service: Gynecology;  Laterality: Right;   Social History   Social History Narrative   Not on file    There is no immunization history on file for this patient.   Objective: Vital Signs: BP (!) 140/94 (BP Location: Left Arm,  Patient Position: Sitting, Cuff Size: Normal)   Pulse 98   Resp 14   Ht 5\' 6"  (1.676 m)   Wt 181 lb (82.1 kg)   LMP 07/06/2022   BMI 29.21 kg/m    Physical Exam HENT:     Mouth/Throat:     Mouth: Mucous membranes are moist.     Pharynx: Oropharynx is clear.  Eyes:     Conjunctiva/sclera: Conjunctivae normal.  Cardiovascular:     Rate and Rhythm: Normal rate and regular rhythm.  Pulmonary:     Effort: Pulmonary effort is normal.     Breath sounds: Normal breath sounds.  Lymphadenopathy:     Cervical: No cervical adenopathy.  Skin:    General: Skin is warm and dry.     Findings: Rash present.     Comments: Flat hyperpigmented patches on both arms largest on the left upper arm, some behind  left ear, no visible scalp inflammation  Neurological:     Mental Status: She is alert.  Psychiatric:        Mood and Affect: Mood normal.      Musculoskeletal Exam:  Shoulders full ROM no tenderness or swelling Elbows full ROM no tenderness or swelling Wrists full ROM no tenderness or swelling Fingers full ROM no tenderness or swelling Knees full ROM no tenderness or swelling   Investigation: No additional findings.  Imaging: No results found.  Recent Labs: Lab Results  Component Value Date   WBC 6.0 02/09/2022   HGB 12.0 02/09/2022   PLT 383 02/09/2022   NA 139 09/29/2021   K 3.9 09/29/2021   CL 108 09/29/2021   CO2 24 09/29/2021   GLUCOSE 108 (H) 09/29/2021   BUN 13 09/29/2021   CREATININE 0.78 09/29/2021   BILITOT 0.3 09/29/2021   ALKPHOS 69 09/29/2021   AST 17 09/29/2021   ALT 15 09/29/2021   PROT 7.7 09/29/2021   ALBUMIN 4.1 09/29/2021   CALCIUM 8.7 (L) 09/29/2021   GFRAA >60 11/02/2015    Speciality Comments: No specialty comments available.  Procedures:  No procedures performed Allergies: Patient has no known allergies.   Assessment / Plan:     Visit Diagnoses: Cutaneous lupus erythematosus - Plan: Sedimentation rate, hydroxychloroquine (PLAQUENIL) 200 MG tablet  Skin disease activity appears well-controlled today.  Plan to continue hydroxychloroquine 200 mg twice daily.  Will check sed rate for systemic disease activity monitoring but appears to be well.  Can continue as needed triamcinolone for breakthrough areas.  Long-term use of hydroxychloroquine - Plan: CBC with Differential/Platelet, BASIC METABOLIC PANEL WITH GFR  Checking CBC and BMP for continued medication monitoring.  Discussed need for retinal toxicity screening exam with long-term use of hydroxychloroquine.  She plans to contact her eye doctor for this as well as general updated eye exam.  If not getting this checked by next week follow-up may need new referral order directing treatment  until gets checked.  Vitamin D deficiency - Plan: VITAMIN D 25 Hydroxy (Vit-D Deficiency, Fractures)  Vitamin D improving but still deficient at 14 with last check in December we will repeat today.  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  Continuing daily supplementation.  Bilateral leg pain - Plan: gabapentin (NEURONTIN) 600 MG tablet  Doing well on the increased gabapentin with her I suspect may be some restless leg symptoms associated with her chronic iron deficiency.  More benefit no side effects will continue the current  dose 600 mg once nightly.   Orders: Orders Placed This Encounter  Procedures   Sedimentation rate   CBC with Differential/Platelet   BASIC METABOLIC PANEL WITH GFR   VITAMIN D 25 Hydroxy (Vit-D Deficiency, Fractures)   Meds ordered this encounter  Medications   gabapentin (NEURONTIN) 600 MG tablet    Sig: Take 1 tablet (600 mg total) by mouth daily.    Dispense:  90 tablet    Refill:  1   hydroxychloroquine (PLAQUENIL) 200 MG tablet    Sig: Take 1 tablet (200 mg total) by mouth 2 (two) times daily.    Dispense:  180 tablet    Refill:  1   triamcinolone ointment (KENALOG) 0.5 %    Sig: Apply 1 Application topically 2 (two) times daily as needed.    Dispense:  15 g    Refill:  1     Follow-Up Instructions: Return in about 6 months (around 01/20/2023) for CCLE on HCQ f/u 6mos.   Fuller Plan, MD  Note - This record has been created using [employer].  Chart creation errors have been sought, but may not always  have been located. Such creation errors do not reflect on  the standard of medical care.

## 2022-07-21 LAB — CBC WITH DIFFERENTIAL/PLATELET
Absolute Monocytes: 745 cells/uL (ref 200–950)
Basophils Absolute: 58 cells/uL (ref 0–200)
Basophils Relative: 0.8 %
Eosinophils Absolute: 533 cells/uL — ABNORMAL HIGH (ref 15–500)
Eosinophils Relative: 7.3 %
HCT: 38.2 % (ref 35.0–45.0)
Hemoglobin: 12.2 g/dL (ref 11.7–15.5)
Lymphs Abs: 1387 cells/uL (ref 850–3900)
MCH: 25.7 pg — ABNORMAL LOW (ref 27.0–33.0)
MCHC: 31.9 g/dL — ABNORMAL LOW (ref 32.0–36.0)
MCV: 80.6 fL (ref 80.0–100.0)
MPV: 11.2 fL (ref 7.5–12.5)
Monocytes Relative: 10.2 %
Neutro Abs: 4577 cells/uL (ref 1500–7800)
Neutrophils Relative %: 62.7 %
Platelets: 432 10*3/uL — ABNORMAL HIGH (ref 140–400)
RBC: 4.74 10*6/uL (ref 3.80–5.10)
RDW: 14.7 % (ref 11.0–15.0)
Total Lymphocyte: 19 %
WBC: 7.3 10*3/uL (ref 3.8–10.8)

## 2022-07-21 LAB — BASIC METABOLIC PANEL WITH GFR
BUN: 19 mg/dL (ref 7–25)
CO2: 27 mmol/L (ref 20–32)
Calcium: 9.4 mg/dL (ref 8.6–10.2)
Chloride: 101 mmol/L (ref 98–110)
Creat: 0.89 mg/dL (ref 0.50–0.97)
Glucose, Bld: 90 mg/dL (ref 65–99)
Potassium: 3.9 mmol/L (ref 3.5–5.3)
Sodium: 136 mmol/L (ref 135–146)
eGFR: 87 mL/min/{1.73_m2} (ref >=60)

## 2022-07-21 LAB — SEDIMENTATION RATE: Sed Rate: 29 mm/h — ABNORMAL HIGH (ref 0–20)

## 2022-07-21 LAB — VITAMIN D 25 HYDROXY (VIT D DEFICIENCY, FRACTURES): Vit D, 25-Hydroxy: 22 ng/mL — ABNORMAL LOW (ref 30–100)

## 2022-07-25 ENCOUNTER — Ambulatory Visit: Payer: BC Managed Care – PPO | Admitting: Internal Medicine

## 2022-08-05 ENCOUNTER — Other Ambulatory Visit: Payer: Self-pay | Admitting: Internal Medicine

## 2022-08-05 DIAGNOSIS — L932 Other local lupus erythematosus: Secondary | ICD-10-CM

## 2023-04-27 ENCOUNTER — Other Ambulatory Visit: Payer: Self-pay | Admitting: Internal Medicine

## 2023-04-27 DIAGNOSIS — L932 Other local lupus erythematosus: Secondary | ICD-10-CM

## 2023-08-04 NOTE — Progress Notes (Signed)
 Office Visit Note  Patient: Dominique Jones             Date of Birth: [birth year]           MRN: [id number]             PCP: Patient, No Pcp Per Referring: No ref. provider found Visit Date: 08/18/2023   Subjective:  Follow-up   Discussed the use of AI scribe software for clinical note transcription with the patient, who gave verbal consent to proceed.  History of Present Illness   Dominique Jones is a 38 y.o. 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    She has been managing her lupus with hydroxychloroquine , with her last prescription filled at the beginning of this month. There was a gap in care due to change of job late last year. She has been adherent to the medication regimen and has not experienced any significant flare-ups recently.  She mentions occasional rashes, particularly on her scalp behind the ears, which she manages with aloe vera, frequent hair washing, and increased use of a topical cream, which has been effective. The rashes have been random, with some appearing on her scalp over the past three months. No new scarring rash areas on the top of her head.  No new viral or bacterial illnesses have been noted in the past four to five months, although she mentions prolonged recovery from colds. She has a history of swollen lymph nodes, which was one of her symptoms before being diagnosed with lupus. She recalls having a biopsy in the past due to swollen lymph nodes and skin spots.  She has glaucoma as part of her medical history. She has not had any recent lab work done and denies any recent use of high systemic steroids or steroid drops. She has not had screening for HCQ retinal toxicity. She is due for an eye exam for her glaucoma as well. She reports fatigue as a normal symptom.   Previous HPI 07/20/2022 Dominique Jones is a 38 y.o. female here for follow up for chronic  cutaneous lupus on hydroxychloroquine  400 mg daily and on gabapentin  600 mg for leg pain at night.  She is additional improvement with the increase gabapentin  dose and feels it is working pretty well.  Does not notice any drowsiness lightheadedness or other side effects.  She has had a few areas of skin irritation some spots around the ear for which she is topical triamcinolone  with improvement.  Some of the red irritated spots on her arm resolved into persistent hyperpigmentation after several months but does not have any new lesions.  Not experiencing any new joint pain or swelling.  She has not seen ophthalmology for monitoring on hydroxychloroquine .  She does already have an eye doctor who she sees for glaucoma.     Previous HPI 02/09/22 Dominique Jones is a 38 y.o. female here for follow up for chronic cutaneous lupus on HCQ 400 mg daily also addition of gabapentin  to 300 mg at night for leg pain. She has noticed some improvement in skin rash especially no more hair falling out or visibly thinning. She finds the gabapentin  partially helpful but sometimes does not notice a benefit.  Her appetite is decreased since our last visit.   Previous HPI 11/16/21 Dominique Jones is a 38 y.o. female here for evaluation and treatment of lupus.  She was previously a patient at [facility] rheumatology with Dr. Mai last seen summer of last year.  Originally diagnosed with lupus for evaluation symptoms starting with facial rashes in December 2020.  Other associated symptoms including lymphadenopathy, myalgias, GI upset, unintentional weight loss, and fatigue.  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  Laboratory testing previously showed positive ANA and SSA antibodies otherwise negative lupus serology.  Symptoms responded to hydroxychloroquine  200 mg daily.  However she felt rheumatology plan was not ideal as she had multiple unresolved symptoms especially with continued muscle pains and  fatigue despite the medication. Her worst area is in the bilateral legs.  She has significant restless leg type of symptoms sometimes with aches and sometimes with burning type of pain throughout her legs.  This is not associated with any visible changes or swelling and usually better during the daytime.  She does not have significant peripheral edema.  Does also have diffuse body pains pretty frequently. Skin rashes started on the face but has subsequently developed involvement throughout her upper extremities and on the neck.  She has minimal problems on the trunk and no rashes on her legs.  These are mostly hyperpigmented she has seen hair loss at affected sites but without any permanent scalp patches affected. She has a history of iron  deficiency anemia associated with lupus and with heavy uterine bleeding. She had myomectomy March this year and multiple rounds with IV iron  replacement. Cervical lymph node swelling comes and goes on both sides of the neck she has tenderness occasionally but more often just bulky or palpable nodules without associated symptoms.  She had at least 1 episode of lymph node swelling in the preauricular area. Does report some dry eyes and mouth sensations does not require specific medications for management.  She is not seeing mouth or nasal ulcers.   Review of Systems  Constitutional:  Positive for fatigue.  HENT:  Negative for mouth sores and mouth dryness.   Eyes:  Positive for dryness.  Respiratory:  Negative for shortness of breath.   Cardiovascular:  Negative for chest pain and palpitations.  Gastrointestinal:  Negative for blood in stool, constipation and diarrhea.  Endocrine: Negative for increased urination.  Genitourinary:  Negative for involuntary urination.  Musculoskeletal:  Positive for myalgias and myalgias. Negative for joint pain, gait problem, joint pain, joint swelling, muscle weakness, morning stiffness and muscle tenderness.  Skin:  Positive for  rash. Negative for color change, hair loss and sensitivity to sunlight.  Allergic/Immunologic: Positive for susceptible to infections.  Neurological:  Negative for dizziness and headaches.  Hematological:  Negative for swollen glands.  Psychiatric/Behavioral:  Positive for sleep disturbance. Negative for depressed mood. The patient is not nervous/anxious.     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   Lymphadenopathy 02/21/2019   Sebaceous cyst 03/22/2016   Hydrosalpinx 11/10/2015   Fibroids 12/30/2012   Anemia 12/30/2012   Laceration of palate 12/30/2012    Past Medical History:  Diagnosis Date   Anemia  Complication of anesthesia    difficult intubation   Ectopic pregnancy    Glaucoma    follows up with opthalmologist once a year for pressure check   Lupus     Family History  Problem Relation Age of Onset   Diabetes Mother    Glaucoma Father    Past Surgical History:  Procedure Laterality Date   LAPAROSCOPY N/A 12/26/2012   Procedure: LAPAROSCOPY OPERATIVE REMOVAL OF ECTOPIC PREGNANCY;  Surgeon: Cynthia Loss, MD;  Location: [facility];  Service: Gynecology;  Laterality: N/A;  Attempted   LAPAROTOMY N/A 12/26/2012   Procedure: LAPAROTOMY;  Surgeon: Cynthia Loss, MD;  Location: [facility];  Service: Gynecology;  Laterality: N/A;   MYOMECTOMY N/A 11/10/2015   Procedure: MYOMECTOMY, Left Salpingectomy, Excision of Incisional Mass;  Surgeon: Shanda SHAUNNA Muscat, MD;  Location: [facility];   Service: Gynecology;  Laterality: N/A;   MYOMECTOMY N/A 05/03/2021   Procedure: ABDOMINAL MYOMECTOMY;  Surgeon: Henry Slough, MD;  Location: [facility];  Service: Gynecology;  Laterality: N/A;   OPERATIVE ULTRASOUND Right 12/14/2012   Procedure: OPERATIVE ULTRASOUND GUIDED INJECTION OF POTASSIUM CHLORIDE  2.5ML;  Surgeon: Cynthia Loss, MD;  Location: [facility];  Service: Gynecology;  Laterality: Right;   REFRACTIVE SURGERY     UNILATERAL SALPINGECTOMY Right 12/26/2012   Procedure: UNILATERAL SALPINGECTOMY;  Surgeon: Cynthia Loss, MD;  Location: [facility];  Service: Gynecology;  Laterality: Right;   Social History   Social History Narrative   Not on file    There is no immunization history on file for this patient.   Objective: Vital Signs: BP (!) 156/97 (BP Location: Left Arm, Patient Position: Sitting, Cuff Size: Normal)   Pulse (!) 101   Resp 16   Ht 5' 6 (1.676 m)   Wt 190 lb (86.2 kg)   LMP 08/11/2023   BMI 30.67 kg/m    Physical Exam HENT:     Mouth/Throat:     Mouth: Mucous membranes are moist.     Pharynx: Oropharynx is clear.   Eyes:     Conjunctiva/sclera: Conjunctivae normal.   Neck:     Comments: Right sided nontender nodule/swelling Cardiovascular:     Rate and Rhythm: Normal rate and regular rhythm.  Pulmonary:     Effort: Pulmonary effort is normal.     Breath sounds: Normal breath sounds.   Musculoskeletal:     Right lower leg: No edema.     Left lower leg: No edema.  Lymphadenopathy:     Cervical: No cervical adenopathy.   Skin:    General: Skin is warm and dry.     Findings: Rash present.     Comments: Small resolving area behind left ear Right upper arm on flexor side faintly erythematous nodule   Neurological:     Mental Status: She is alert.   Psychiatric:        Mood and Affect: Mood normal.      Musculoskeletal Exam:  Shoulders full ROM no tenderness or swelling Elbows full ROM no tenderness or swelling Wrists  full ROM no tenderness or swelling Fingers full ROM no tenderness or swelling Knees full ROM no tenderness or swelling Ankles full ROM no tenderness or swelling   Investigation: No additional findings.  Imaging: No results found.  Recent Labs: Lab Results  Component Value Date   WBC 7.3 07/20/2022   HGB 12.2 07/20/2022   PLT 432 (H) 07/20/2022   NA 136 07/20/2022   K 3.9 07/20/2022   CL 101 07/20/2022  CO2 27 07/20/2022   GLUCOSE 90 07/20/2022   BUN 19 07/20/2022   CREATININE 0.89 07/20/2022   BILITOT 0.3 09/29/2021   ALKPHOS 69 09/29/2021   AST 17 09/29/2021   ALT 15 09/29/2021   PROT 7.7 09/29/2021   ALBUMIN 4.1 09/29/2021   CALCIUM 9.4 07/20/2022   GFRAA >60 11/02/2015    Speciality Comments: No specialty comments available.  Procedures:  No procedures performed Allergies: Patient has no known allergies.   Assessment / Plan:     Visit Diagnoses: Cutaneous lupus erythematosus - Can continue as needed triamcinolone  for breakthrough areas. - Plan: CBC with Differential/Platelet, Basic Metabolic Panel (BMET), hydroxychloroquine  (PLAQUENIL ) 200 MG tablet, triamcinolone  ointment (KENALOG ) 0.5 % Lupus affecting scalp with recent rash increase but also had drug interruption. Long-term hydroxychloroquine  use requires annual eye exams. - Refilled hydroxychloroquine  400 mg daily for three months, needs eye exam for further refills. - Ordered blood tests for kidney function and WBC count.  Long-term use of hydroxychloroquine  - hydroxychloroquine  200 mg twice daily. Needs a PLQ eye exam. - Plan: CBC with Differential/Platelet, Basic Metabolic Panel (BMET) - Checking CBC and BMP for long term medication monitoring - Encouraged annual eye exam.  Vitamin D  deficiency - Continuing daily supplementation. - Plan: VITAMIN D  25 Hydroxy (Vit-D Deficiency, Fractures) Rechecking, now reports on daily supplerment 2000 IUs. Discussed importance of UV skin protection and vitamin D   supplement with lupus. -Checking vitamin D  level  Bilateral leg pain - Gabapentin  600 mg once nightly. - Plan: gabapentin  (NEURONTIN ) 600 MG tablet   Orders: Orders Placed This Encounter  Procedures   CBC with Differential/Platelet   Basic Metabolic Panel (BMET)   VITAMIN D  25 Hydroxy (Vit-D Deficiency, Fractures)   Meds ordered this encounter  Medications   hydroxychloroquine  (PLAQUENIL ) 200 MG tablet    Sig: Take 1 tablet (200 mg total) by mouth 2 (two) times daily.    Dispense:  180 tablet    Refill:  0   gabapentin  (NEURONTIN ) 600 MG tablet    Sig: Take 1 tablet (600 mg total) by mouth daily.    Dispense:  90 tablet    Refill:  1   triamcinolone  ointment (KENALOG ) 0.5 %    Sig: Apply 1 Application topically 2 (two) times daily as needed.    Dispense:  15 g    Refill:  1     Follow-Up Instructions: No follow-ups on file.   Lonni LELON Ester, MD  Note - This record has been created using [employer].  Chart creation errors have been sought, but may not always  have been located. Such creation errors do not reflect on  the standard of medical care.

## 2023-08-11 ENCOUNTER — Encounter: Payer: Self-pay | Admitting: Hematology and Oncology

## 2023-08-17 ENCOUNTER — Encounter: Payer: Self-pay | Admitting: Hematology and Oncology

## 2023-08-18 ENCOUNTER — Encounter: Payer: Self-pay | Admitting: Internal Medicine

## 2023-08-18 ENCOUNTER — Ambulatory Visit: Attending: Internal Medicine | Admitting: Internal Medicine

## 2023-08-18 VITALS — BP 156/97 | HR 101 | Resp 16 | Ht 66.0 in | Wt 190.0 lb

## 2023-08-18 DIAGNOSIS — Z79899 Other long term (current) drug therapy: Secondary | ICD-10-CM

## 2023-08-18 DIAGNOSIS — L932 Other local lupus erythematosus: Secondary | ICD-10-CM | POA: Diagnosis not present

## 2023-08-18 DIAGNOSIS — E559 Vitamin D deficiency, unspecified: Secondary | ICD-10-CM | POA: Diagnosis not present

## 2023-08-18 DIAGNOSIS — M3219 Other organ or system involvement in systemic lupus erythematosus: Secondary | ICD-10-CM

## 2023-08-18 DIAGNOSIS — M79604 Pain in right leg: Secondary | ICD-10-CM | POA: Diagnosis not present

## 2023-08-18 DIAGNOSIS — M79605 Pain in left leg: Secondary | ICD-10-CM

## 2023-08-18 MED ORDER — TRIAMCINOLONE ACETONIDE 0.5 % EX OINT
1.0000 | TOPICAL_OINTMENT | Freq: Two times a day (BID) | CUTANEOUS | 1 refills | Status: AC | PRN
Start: 2023-08-18 — End: ?

## 2023-08-18 MED ORDER — GABAPENTIN 600 MG PO TABS
600.0000 mg | ORAL_TABLET | Freq: Every day | ORAL | 1 refills | Status: AC
Start: 2023-08-18 — End: ?

## 2023-08-18 MED ORDER — HYDROXYCHLOROQUINE SULFATE 200 MG PO TABS
200.0000 mg | ORAL_TABLET | Freq: Two times a day (BID) | ORAL | 0 refills | Status: AC
Start: 2023-08-18 — End: ?

## 2023-08-19 LAB — CBC WITH DIFFERENTIAL/PLATELET
Absolute Lymphocytes: 1650 {cells}/uL (ref 850–3900)
Absolute Monocytes: 532 {cells}/uL (ref 200–950)
Basophils Absolute: 92 {cells}/uL (ref 0–200)
Basophils Relative: 2.1 %
Eosinophils Absolute: 321 {cells}/uL (ref 15–500)
Eosinophils Relative: 7.3 %
HCT: 35 % (ref 35.0–45.0)
Hemoglobin: 10.3 g/dL — ABNORMAL LOW (ref 11.7–15.5)
MCH: 23.5 pg — ABNORMAL LOW (ref 27.0–33.0)
MCHC: 29.4 g/dL — ABNORMAL LOW (ref 32.0–36.0)
MCV: 79.9 fL — ABNORMAL LOW (ref 80.0–100.0)
MPV: 11 fL (ref 7.5–12.5)
Monocytes Relative: 12.1 %
Neutro Abs: 1804 {cells}/uL (ref 1500–7800)
Neutrophils Relative %: 41 %
Platelets: 322 10*3/uL (ref 140–400)
RBC: 4.38 10*6/uL (ref 3.80–5.10)
RDW: 15.3 % — ABNORMAL HIGH (ref 11.0–15.0)
Total Lymphocyte: 37.5 %
WBC: 4.4 10*3/uL (ref 3.8–10.8)

## 2023-08-19 LAB — BASIC METABOLIC PANEL WITH GFR
BUN: 16 mg/dL (ref 7–25)
CO2: 26 mmol/L (ref 20–32)
Calcium: 8.8 mg/dL (ref 8.6–10.2)
Chloride: 104 mmol/L (ref 98–110)
Creat: 0.74 mg/dL (ref 0.50–0.97)
Glucose, Bld: 94 mg/dL (ref 65–99)
Potassium: 4.9 mmol/L (ref 3.5–5.3)
Sodium: 136 mmol/L (ref 135–146)
eGFR: 107 mL/min/{1.73_m2} (ref >=60)

## 2023-08-19 LAB — VITAMIN D 25 HYDROXY (VIT D DEFICIENCY, FRACTURES): Vit D, 25-Hydroxy: 25 ng/mL — ABNORMAL LOW (ref 30–100)

## 2023-10-27 NOTE — Progress Notes (Deleted)
 Office Visit Note  Patient: Dominique Jones             Date of Birth: 1985/09/01           MRN: [id number]             PCP: Patient, No Pcp Per Referring: No ref. provider found Visit Date: 11/10/2023   Subjective:  No chief complaint on file.   History of Present Illness: Dominique Jones is a 38 y.o. 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    Previous HPI 08/18/2023 Dominique Jones is a 38 y.o. 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     She has been managing her lupus with hydroxychloroquine , with her last prescription filled at the beginning of this month. There was a gap in care due to change of job late last year. She has been adherent to the medication regimen and has not experienced any significant flare-ups recently.   She mentions occasional rashes, particularly on her scalp behind the ears, which she manages with aloe vera, frequent hair washing, and increased use of a topical cream, which has been effective. The rashes have been random, with some appearing on her scalp over the past three months. No new scarring rash areas on the top of her head.   No new viral or bacterial illnesses have been noted in the past four to five months, although she mentions prolonged recovery from colds. She has a history of swollen lymph nodes, which was one of her symptoms before being diagnosed with lupus. She recalls having a biopsy in the past due to swollen lymph nodes and skin spots.   She has glaucoma as part of her medical history. She has not had any recent lab work done and denies any recent use of high systemic steroids or steroid drops. She has not had screening for HCQ retinal toxicity. She is due for an eye exam for her glaucoma as well. She reports fatigue as a normal symptom.     Previous  HPI 07/20/2022 [name] is a 38 y.o. 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  She is additional improvement with the increase gabapentin  dose and feels it is working pretty well.  Does not notice any drowsiness lightheadedness or other side effects.  She has had a few areas of skin irritation some spots around the ear for which she is topical triamcinolone  with improvement.  Some of the red irritated spots on her arm resolved into persistent hyperpigmentation after several months but does not have any new lesions.  Not experiencing any new joint pain or swelling.  She has not seen ophthalmology for monitoring on hydroxychloroquine .  She does already have an eye doctor who she sees for glaucoma.     Previous HPI 02/09/22 Dominique Jones is a 38 y.o. female here for follow up for chronic cutaneous lupus on HCQ 400 mg daily also addition of gabapentin  to 300 mg at night for leg pain. She has noticed some improvement in skin rash especially no more hair falling out or visibly thinning. She finds the gabapentin  partially helpful but sometimes does not notice a benefit.  Her appetite is decreased since  our last visit.   Previous HPI 11/16/21 Dominique Jones is a 38 y.o. female here for evaluation and treatment of lupus.  She was previously a patient at [facility] rheumatology with Dr. Mai last seen summer of last year.  Originally diagnosed with lupus for evaluation symptoms starting with facial rashes in December 2020.  Other associated symptoms including lymphadenopathy, myalgias, GI upset, unintentional weight loss, and fatigue.  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  Laboratory testing previously showed positive ANA and SSA antibodies otherwise negative lupus serology.  Symptoms responded to hydroxychloroquine  200 mg daily.  However she felt rheumatology plan was not  ideal as she had multiple unresolved symptoms especially with continued muscle pains and fatigue despite the medication. Her worst area is in the bilateral legs.  She has significant restless leg type of symptoms sometimes with aches and sometimes with burning type of pain throughout her legs.  This is not associated with any visible changes or swelling and usually better during the daytime.  She does not have significant peripheral edema.  Does also have diffuse body pains pretty frequently. Skin rashes started on the face but has subsequently developed involvement throughout her upper extremities and on the neck.  She has minimal problems on the trunk and no rashes on her legs.  These are mostly hyperpigmented she has seen hair loss at affected sites but without any permanent scalp patches affected. She has a history of iron  deficiency anemia associated with lupus and with heavy uterine bleeding. She had myomectomy March this year and multiple rounds with IV iron  replacement. Cervical lymph node swelling comes and goes on both sides of the neck she has tenderness occasionally but more often just bulky or palpable nodules without associated symptoms.  She had at least 1 episode of lymph node swelling in the preauricular area. Does report some dry eyes and mouth sensations does not require specific medications for management.  She is not seeing mouth or nasal ulcers.   No Rheumatology ROS completed.   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   Lymphadenopathy 02/21/2019   Sebaceous cyst 03/22/2016   Hydrosalpinx 11/10/2015   Fibroids 12/30/2012   Anemia 12/30/2012   Laceration of palate 12/30/2012    Past Medical History:  Diagnosis Date   Anemia    Complication of anesthesia    difficult intubation   Ectopic pregnancy    Glaucoma    follows up with opthalmologist once a year for pressure check   Lupus     Family History  Problem Relation Age of Onset   Diabetes Mother    Glaucoma Father    Past Surgical History:  Procedure Laterality Date   LAPAROSCOPY N/A 12/26/2012   Procedure: LAPAROSCOPY OPERATIVE REMOVAL OF ECTOPIC PREGNANCY;  Surgeon: Cynthia Loss, MD;  Location: [facility];  Service: Gynecology;  Laterality: N/A;  Attempted   LAPAROTOMY N/A 12/26/2012   Procedure: LAPAROTOMY;  Surgeon: Cynthia Loss, MD;  Location: [facility];  Service: Gynecology;  Laterality: N/A;  MYOMECTOMY N/A 11/10/2015   Procedure: MYOMECTOMY, Left Salpingectomy, Excision of Incisional Mass;  Surgeon: Shanda SHAUNNA Muscat, MD;  Location: [facility];  Service: Gynecology;  Laterality: N/A;   MYOMECTOMY N/A 05/03/2021   Procedure: ABDOMINAL MYOMECTOMY;  Surgeon: Henry Slough, MD;  Location: [facility];  Service: Gynecology;  Laterality: N/A;   OPERATIVE ULTRASOUND Right 12/14/2012   Procedure: OPERATIVE ULTRASOUND GUIDED INJECTION OF POTASSIUM CHLORIDE  2.5ML;  Surgeon: Cynthia Loss, MD;  Location: [facility];  Service: Gynecology;  Laterality: Right;   REFRACTIVE SURGERY     UNILATERAL SALPINGECTOMY Right 12/26/2012   Procedure: UNILATERAL SALPINGECTOMY;  Surgeon: Cynthia Loss, MD;  Location: [facility];  Service: Gynecology;  Laterality: Right;   Social History   Social History Narrative   Not on file    There is no immunization history on file for this patient.   Objective: Vital Signs: There were no vitals taken for this visit.   Physical Exam   Musculoskeletal Exam: ***  CDAI  Exam: CDAI Score: -- Patient Global: --; Provider Global: -- Swollen: --; Tender: -- Joint Exam 11/10/2023   No joint exam has been documented for this visit   There is currently no information documented on the homunculus. Go to the Rheumatology activity and complete the homunculus joint exam.  Investigation: No additional findings.  Imaging: No results found.  Recent Labs: Lab Results  Component Value Date   WBC 4.4 08/18/2023   HGB 10.3 (L) 08/18/2023   PLT 322 08/18/2023   NA 136 08/18/2023   K 4.9 08/18/2023   CL 104 08/18/2023   CO2 26 08/18/2023   GLUCOSE 94 08/18/2023   BUN 16 08/18/2023   CREATININE 0.74 08/18/2023   BILITOT 0.3 09/29/2021   ALKPHOS 69 09/29/2021   AST 17 09/29/2021   ALT 15 09/29/2021   PROT 7.7 09/29/2021   ALBUMIN 4.1 09/29/2021   CALCIUM 8.8 08/18/2023   GFRAA >60 11/02/2015    Speciality Comments: No specialty comments available.  Procedures:  No procedures performed Allergies: Patient has no known allergies.   Assessment / Plan:     Visit Diagnoses: No diagnosis found.  ***  Orders: No orders of the defined types were placed in this encounter.  No orders of the defined types were placed in this encounter.    Follow-Up Instructions: No follow-ups on file.   Gianne Shugars M Nashly Olsson, CMA  Note - This record has been created using [employer].  Chart creation errors have been sought, but may not always  have been located. Such creation errors do not reflect on  the standard of medical care.

## 2023-11-10 ENCOUNTER — Ambulatory Visit: Admitting: Internal Medicine

## 2023-11-10 DIAGNOSIS — E559 Vitamin D deficiency, unspecified: Secondary | ICD-10-CM

## 2023-11-10 DIAGNOSIS — L932 Other local lupus erythematosus: Secondary | ICD-10-CM

## 2023-11-10 DIAGNOSIS — M79605 Pain in left leg: Secondary | ICD-10-CM

## 2023-11-10 DIAGNOSIS — Z79899 Other long term (current) drug therapy: Secondary | ICD-10-CM
